# Patient Record
Sex: Male | Born: 1965
Health system: Southern US, Community
[De-identification: ages and names within clinical notes are randomized; demographics above are authoritative.]

## PROBLEM LIST (undated history)

## (undated) DIAGNOSIS — R3129 Other microscopic hematuria: Secondary | ICD-10-CM

## (undated) DIAGNOSIS — M545 Low back pain, unspecified: Secondary | ICD-10-CM

## (undated) DIAGNOSIS — F419 Anxiety disorder, unspecified: Secondary | ICD-10-CM

## (undated) DIAGNOSIS — N400 Enlarged prostate without lower urinary tract symptoms: Secondary | ICD-10-CM

## (undated) DIAGNOSIS — N529 Male erectile dysfunction, unspecified: Secondary | ICD-10-CM

## (undated) DIAGNOSIS — R3915 Urgency of urination: Secondary | ICD-10-CM

## (undated) DIAGNOSIS — N50811 Right testicular pain: Secondary | ICD-10-CM

## (undated) DIAGNOSIS — F32A Depression, unspecified: Secondary | ICD-10-CM

## (undated) DIAGNOSIS — F329 Major depressive disorder, single episode, unspecified: Secondary | ICD-10-CM

## (undated) DIAGNOSIS — I10 Essential (primary) hypertension: Secondary | ICD-10-CM

## (undated) DIAGNOSIS — Z1211 Encounter for screening for malignant neoplasm of colon: Secondary | ICD-10-CM

## (undated) DIAGNOSIS — R351 Nocturia: Secondary | ICD-10-CM

## (undated) DIAGNOSIS — J45909 Unspecified asthma, uncomplicated: Secondary | ICD-10-CM

## (undated) HISTORY — DX: Depression, unspecified: F32.A

## (undated) HISTORY — DX: Encounter for screening for malignant neoplasm of colon: Z12.11

## (undated) HISTORY — PX: OTHER SURGICAL HISTORY: SHX169

## (undated) HISTORY — DX: Anxiety disorder, unspecified: F41.9

## (undated) HISTORY — DX: Urgency of urination: R39.15

## (undated) HISTORY — DX: Low back pain: M54.5

## (undated) HISTORY — DX: Nocturia: R35.1

## (undated) HISTORY — DX: Major depressive disorder, single episode, unspecified: F32.9

## (undated) HISTORY — DX: Other microscopic hematuria: R31.29

## (undated) HISTORY — DX: Right testicular pain: N50.811

## (undated) HISTORY — DX: Essential (primary) hypertension: I10

## (undated) HISTORY — DX: Low back pain, unspecified: M54.50

## (undated) HISTORY — DX: Benign prostatic hyperplasia without lower urinary tract symptoms: N40.0

## (undated) HISTORY — DX: Male erectile dysfunction, unspecified: N52.9

---

## 2008-08-12 ENCOUNTER — Emergency Department: Payer: Self-pay | Admitting: Emergency Medicine

## 2008-09-19 ENCOUNTER — Ambulatory Visit: Payer: Self-pay | Admitting: Urology

## 2013-02-15 LAB — PSA: PSA: 2.5

## 2013-02-15 LAB — BASIC METABOLIC PANEL
BUN: 12 mg/dL (ref 4–21)
Creatinine: 0.8 mg/dL (ref <=1.3)
GLUCOSE: 104 mg/dL

## 2013-02-15 LAB — CBC AND DIFFERENTIAL: Hemoglobin: 15.2 g/dL (ref 13.5–17.5)

## 2013-02-15 LAB — LIPID PANEL
Cholesterol: 189 mg/dL (ref 0–200)
HDL: 46 mg/dL (ref 35–70)
LDL Cholesterol: 123 mg/dL
TRIGLYCERIDES: 101 mg/dL (ref 40–160)

## 2013-02-15 LAB — TSH: TSH: 1.9 u[IU]/mL (ref <=5.90)

## 2013-06-18 ENCOUNTER — Ambulatory Visit: Payer: Self-pay | Admitting: Emergency Medicine

## 2013-06-18 ENCOUNTER — Emergency Department: Payer: Self-pay | Admitting: Emergency Medicine

## 2015-01-03 ENCOUNTER — Ambulatory Visit: Payer: Self-pay | Admitting: Family Medicine

## 2015-05-26 ENCOUNTER — Other Ambulatory Visit: Payer: Self-pay | Admitting: Internal Medicine

## 2015-06-17 ENCOUNTER — Encounter: Payer: Self-pay | Admitting: Internal Medicine

## 2015-06-17 DIAGNOSIS — N400 Enlarged prostate without lower urinary tract symptoms: Secondary | ICD-10-CM | POA: Insufficient documentation

## 2015-06-17 DIAGNOSIS — C61 Malignant neoplasm of prostate: Secondary | ICD-10-CM | POA: Insufficient documentation

## 2015-06-17 DIAGNOSIS — N529 Male erectile dysfunction, unspecified: Secondary | ICD-10-CM | POA: Insufficient documentation

## 2015-06-17 DIAGNOSIS — I10 Essential (primary) hypertension: Secondary | ICD-10-CM | POA: Insufficient documentation

## 2015-06-28 ENCOUNTER — Ambulatory Visit: Payer: Self-pay | Admitting: Internal Medicine

## 2015-07-23 ENCOUNTER — Other Ambulatory Visit: Payer: Self-pay | Admitting: Internal Medicine

## 2015-09-12 ENCOUNTER — Encounter: Payer: Self-pay | Admitting: Internal Medicine

## 2015-09-25 ENCOUNTER — Other Ambulatory Visit: Payer: Self-pay | Admitting: Internal Medicine

## 2015-10-02 ENCOUNTER — Other Ambulatory Visit: Payer: Self-pay | Admitting: Internal Medicine

## 2015-10-21 ENCOUNTER — Other Ambulatory Visit: Payer: Self-pay | Admitting: Internal Medicine

## 2015-11-18 ENCOUNTER — Other Ambulatory Visit: Payer: Self-pay | Admitting: Internal Medicine

## 2015-12-08 NOTE — Telephone Encounter (Signed)
Left 18messages for pt to call the office, pt never return any of my calls

## 2015-12-16 ENCOUNTER — Other Ambulatory Visit: Payer: Self-pay | Admitting: Internal Medicine

## 2015-12-25 ENCOUNTER — Other Ambulatory Visit: Payer: Self-pay | Admitting: Internal Medicine

## 2016-01-08 ENCOUNTER — Other Ambulatory Visit: Payer: Self-pay | Admitting: Internal Medicine

## 2016-01-08 ENCOUNTER — Other Ambulatory Visit: Payer: Self-pay

## 2016-01-08 ENCOUNTER — Telehealth: Payer: Self-pay

## 2016-01-08 MED ORDER — AMLODIPINE BESYLATE 5 MG PO TABS: 5.0000 mg | ORAL_TABLET | Freq: Every day | ORAL | Status: DC

## 2016-01-08 NOTE — Telephone Encounter (Signed)
I will still need to see him within the next three months.  We do work with patients who have no insurance.

## 2016-01-08 NOTE — Telephone Encounter (Signed)
Patient states that he has currently loss insurance and is unable to come in right now, secondary to lack of funds. He states that hopefully in the next few months he will have his insurance back.

## 2016-01-08 NOTE — Telephone Encounter (Signed)
Patient called requesting refill of medication.

## 2016-01-09 NOTE — Telephone Encounter (Signed)
LM informing patient.

## 2016-03-02 HISTORY — PX: SHOULDER OPEN ROTATOR CUFF REPAIR: SHX2407

## 2016-03-19 ENCOUNTER — Encounter: Payer: Self-pay | Admitting: Internal Medicine

## 2016-04-02 ENCOUNTER — Other Ambulatory Visit: Payer: Self-pay | Admitting: Internal Medicine

## 2016-05-09 ENCOUNTER — Ambulatory Visit
Admission: RE | Admit: 2016-05-09 | Discharge: 2016-05-09 | Disposition: A | Discharge status: Home / Self Care | Payer: Self-pay | Source: Ambulatory Visit | Attending: Urology | Admitting: Urology

## 2016-05-09 ENCOUNTER — Encounter: Payer: Self-pay | Admitting: Urology

## 2016-05-09 ENCOUNTER — Ambulatory Visit (INDEPENDENT_AMBULATORY_CARE_PROVIDER_SITE_OTHER): Payer: Self-pay | Admitting: Urology

## 2016-05-09 VITALS — BP 127/74 | HR 71 | Ht 71.0 in | Wt 167.6 lb

## 2016-05-09 DIAGNOSIS — N3941 Urge incontinence: Secondary | ICD-10-CM

## 2016-05-09 DIAGNOSIS — N401 Enlarged prostate with lower urinary tract symptoms: Secondary | ICD-10-CM

## 2016-05-09 DIAGNOSIS — M545 Low back pain, unspecified: Secondary | ICD-10-CM

## 2016-05-09 DIAGNOSIS — N138 Other obstructive and reflux uropathy: Secondary | ICD-10-CM

## 2016-05-09 DIAGNOSIS — R8290 Unspecified abnormal findings in urine: Secondary | ICD-10-CM

## 2016-05-09 DIAGNOSIS — R361 Hematospermia: Secondary | ICD-10-CM

## 2016-05-09 DIAGNOSIS — R300 Dysuria: Secondary | ICD-10-CM

## 2016-05-09 LAB — MICROSCOPIC EXAMINATION: BACTERIA UA: NONE SEEN

## 2016-05-09 LAB — URINALYSIS, COMPLETE
BILIRUBIN UA: NEGATIVE
Glucose, UA: NEGATIVE
KETONES UA: NEGATIVE
NITRITE UA: NEGATIVE
PH UA: 5.5 (ref 5.0–7.5)
RBC, UA: NEGATIVE
Specific Gravity, UA: 1.02 (ref 1.005–1.030)
UUROB: 0.2 mg/dL (ref 0.2–1.0)

## 2016-05-09 LAB — BLADDER SCAN AMB NON-IMAGING: Scan Result: 0

## 2016-05-09 NOTE — Progress Notes (Signed)
05/09/2016 10:05 AM   Nicholas [name] Mar 02, 1966 [id number]  Referring provider: No referring provider defined for this encounter.  Chief Complaint  Patient presents with  . Cloudy urine with bad odor  . Blood in semen    HPI: Patient is a 50 year old African American male who presents today requesting an appointment for dysuria, malodorous cloudy urine and hematospermia.    He states one week ago, he was awaken with a strong urge to void.  He experienced dysuria and cloudy urine with that first void.  He then had no difficulty voiding the rest of the day.  The next day, the same symptoms.  On the third day, the symptoms persisted throughout the entire day.  He then took his wife's Macrobid for 5 days and the symptoms abated.   He did not experiencing gross hematuria, suprapubic pain or flank pain.  He does admit to back pain.  He did not have fevers, chills, nausea or vomiting.  He states he has been having hematospermia for the last 6 months.  He is not having painful ejaculations, painful erections or experienced any trauma to the penis.    Today, he is still experiencing dysuria.  His UA was positive for > 30 WBC's/hpf.  His PVR is 0 mL.  His IPSS score is 12/2.        IPSS      05/09/16 0900       International Prostate Symptom Score   How often have you had the sensation of not emptying your bladder? Less than half the time     How often have you had to urinate less than every two hours? Less than 1 in 5 times     How often have you found you stopped and started again several times when you urinated? Less than 1 in 5 times     How often have you found it difficult to postpone urination? Less than half the time     How often have you had a weak urinary stream? Less than half the time     How often have you had to strain to start urination? About half the time     How many times did you typically get up at night to urinate? 1 Time     Total IPSS Score 12     Quality of Life due to urinary symptoms   If you were to spend the rest of your life with your urinary condition just the way it is now how would you feel about that? Mostly Satisfied        Score:  1-7 Mild 8-19 Moderate 20-35 Severe     PMH: Past Medical History  Diagnosis Date  . Low back pain   . ED (erectile dysfunction)   . HTN (hypertension)   . Microscopic hematuria   . BPH (benign prostatic hyperplasia)   . Nocturia   . Urgency of micturation     Surgical History: Past Surgical History  Procedure Laterality Date  . Shoulder surgery Right   . Shoulder open rotator cuff repair Right 03/2016    Home Medications:    Medication List       This list is accurate as of: 05/09/16 10:05 AM.  Always use your most recent med list.               amLODipine 5 MG tablet  Commonly known as:  NORVASC  TAKE 1 TABLET (5 MG TOTAL) BY MOUTH  DAILY.     cyclobenzaprine 10 MG tablet  Commonly known as:  FLEXERIL  Take 10 mg by mouth 3 (three) times daily as needed. Reported on 05/09/2016     escitalopram 10 MG tablet  Commonly known as:  LEXAPRO  Take by mouth. Reported on 05/09/2016     HYDROcodone-acetaminophen 5-325 MG tablet  Commonly known as:  NORCO/VICODIN  Take 1 tablet by mouth every 6 (six) hours as needed. Reported on 05/09/2016     traMADol 50 MG tablet  Commonly known as:  ULTRAM  Take 50 mg by mouth. Reported on 05/09/2016        Allergies: No Known Allergies  Family History: Family History  Problem Relation Age of Onset  . CAD Mother   . Lung cancer Father   . Kidney disease Neg Hx   . Prostate cancer Neg Hx   . COPD Mother     Social History:  reports that he has never smoked. He does not have any smokeless tobacco history on file. He reports that he drinks about 1.2 oz of alcohol per week. He reports that he does not use illicit drugs.  ROS: UROLOGY Frequent Urination?: No Hard to postpone urination?: No Burning/pain with urination?: Yes Get  up at night to urinate?: No Leakage of urine?: No Urine stream starts and stops?: No Trouble starting stream?: No Do you have to strain to urinate?: No Blood in urine?: No Urinary tract infection?: Yes Sexually transmitted disease?: No Injury to kidneys or bladder?: No Painful intercourse?: No Weak stream?: No Erection problems?: No Penile pain?: No  Gastrointestinal Nausea?: No Vomiting?: No Indigestion/heartburn?: No Diarrhea?: No Constipation?: No  Constitutional Fever: No Night sweats?: No Weight loss?: No Fatigue?: No  Skin Skin rash/lesions?: No Itching?: No  Eyes Blurred vision?: No Double vision?: No  Ears/Nose/Throat Sore throat?: No Sinus problems?: No  Hematologic/Lymphatic Swollen glands?: No Easy bruising?: No  Cardiovascular Leg swelling?: No Chest pain?: No  Respiratory Cough?: No Shortness of breath?: No  Endocrine Excessive thirst?: No  Musculoskeletal Back pain?: No Joint pain?: No  Neurological Headaches?: No Dizziness?: No  Psychologic Depression?: No Anxiety?: No  Physical Exam: BP 127/74 mmHg  Pulse 71  Ht 5\' 11"  (1.803 m)  Wt 167 lb 9.6 oz (76.023 kg)  BMI 23.39 kg/m2  Constitutional: Well nourished. Alert and oriented, No acute distress. HEENT: [place] AT, moist mucus membranes. Trachea midline, no masses. Cardiovascular: No clubbing, cyanosis, or edema. Respiratory: Normal respiratory effort, no increased work of breathing. GI: Abdomen is soft, non tender, non distended, no abdominal masses. Liver and spleen not palpable.  No hernias appreciated.  Stool sample for occult testing is not indicated.   GU: No CVA tenderness.  No bladder fullness or masses.  Patient with circumcised phallus.  Urethral meatus is patent.  No penile discharge. No penile lesions or rashes. Scrotum without lesions, cysts, rashes and/or edema.  Testicles are located scrotally bilaterally. No masses are appreciated in the testicles. Left and right  epididymis are normal. Rectal: Patient with  normal sphincter tone. Anus and perineum without scarring or rashes. No rectal masses are appreciated. Prostate is approximately 55 grams, deep median sulcus, no nodules are appreciated. Seminal vesicles are normal. Skin: No rashes, bruises or suspicious lesions. Lymph: No cervical or inguinal adenopathy. Neurologic: Grossly intact, no focal deficits, moving all 4 extremities. Psychiatric: Normal mood and affect.  Laboratory Data: Lab Results  Component Value Date   HGB 15.2 02/15/2013    Lab Results  Component Value Date   CREATININE 0.8 02/15/2013    Lab Results  Component Value Date   PSA 2.5 02/15/2013     Lab Results  Component Value Date   TSH 1.90 02/15/2013       Component Value Date/Time   CHOL 189 02/15/2013   HDL 46 02/15/2013   LDLCALC 123 02/15/2013      Urinalysis Results for orders placed or performed in visit on 05/09/16  CULTURE, URINE COMPREHENSIVE  Result Value Ref Range   Urine Culture, Comprehensive Final report (A)    Result 1 Proteus mirabilis (A)    ANTIMICROBIAL SUSCEPTIBILITY Comment   Microscopic Examination  Result Value Ref Range   WBC, UA 11-30 (A) 0 -  5 /hpf   RBC, UA 0-2 0 -  2 /hpf   Epithelial Cells (non renal) 0-10 0 - 10 /hpf   Mucus, UA Present (A) Not Estab.   Bacteria, UA None seen None seen/Few  Urinalysis, Complete  Result Value Ref Range   Specific Gravity, UA 1.020 1.005 - 1.030   pH, UA 5.5 5.0 - 7.5   Color, UA Yellow Yellow   Appearance Ur Hazy (A) Clear   Leukocytes, UA 1+ (A) Negative   Protein, UA Trace (A) Negative/Trace   Glucose, UA Negative Negative   Ketones, UA Negative Negative   RBC, UA Negative Negative   Bilirubin, UA Negative Negative   Urobilinogen, Ur 0.2 0.2 - 1.0 mg/dL   Nitrite, UA Negative Negative   Microscopic Examination See below:   PSA  Result Value Ref Range   Prostate Specific Ag, Serum 35.9 (H) 0.0 - 4.0 ng/mL  BLADDER SCAN AMB  NON-IMAGING  Result Value Ref Range   Scan Result 0    Pertinent Imaging: Results for SANEL, SEUFERT (MRN [id number]) as of 05/13/2016 09:45  Ref. Range 05/09/2016 09:53  Scan Result Unknown 0    Assessment & Plan:    1. Dysuria:   UA significant for pus cells.  I will send for culture.  I will hold on prescribing an antibiotic at this time until sensitivities are available.  I will obtain a KUB at this time to rule out stones.    - Urinalysis, Complete - CULTURE, URINE COMPREHENSIVE - BLADDER SCAN AMB NON-IMAGING  2. Malodorous urine:  See above.    3. Back pain:   See above.  4. Hematospermia:   Patient is reassured that this is a benign finding and self limiting.    5. BPH with LUTS:   It has been over two years since patient has had his last PSA.  I will obtain one today as he is inconsistent in his follow ups.     Return for pending KUB and PSA results.  These notes generated with voice recognition software. I apologize for typographical errors.  Zara Council, [facility] [address], [place], [place] [address] [phone]

## 2016-05-10 ENCOUNTER — Telehealth: Payer: Self-pay

## 2016-05-10 LAB — PSA: PROSTATE SPECIFIC AG, SERUM: 35.9 ng/mL — AB (ref 0.0–4.0)

## 2016-05-10 NOTE — Telephone Encounter (Signed)
Spoke with pt in reference to lab results. Pt voiced understanding.  

## 2016-05-10 NOTE — Telephone Encounter (Signed)
LMOM

## 2016-05-10 NOTE — Telephone Encounter (Signed)
-----   Message from Nori Riis, PA-C sent at 05/10/2016  8:24 AM EDT ----- Patient's PSA is elevated, most likely this is due to an infection.  His urine culture is still pending at this time.  We will need to start an antibiotic when the culture results are available on Monday.

## 2016-05-10 NOTE — Telephone Encounter (Signed)
-----   Message from Nori Riis, PA-C sent at 05/09/2016  1:37 PM EDT ----- No stones seen on KUB.  PSA is still pending.

## 2016-05-12 LAB — CULTURE, URINE COMPREHENSIVE

## 2016-05-13 ENCOUNTER — Telehealth: Payer: Self-pay | Admitting: Urology

## 2016-05-13 DIAGNOSIS — N410 Acute prostatitis: Secondary | ICD-10-CM

## 2016-05-13 MED ORDER — AMOXICILLIN-POT CLAVULANATE 875-125 MG PO TABS: 1.0000 | ORAL_TABLET | Freq: Two times a day (BID) | ORAL | Status: AC

## 2016-05-13 NOTE — Telephone Encounter (Signed)
Spoke with pt in reference to abx. Made aware sent to pharmacy. Pt voiced understanding. Pt requested u/s results. Please advise.

## 2016-05-13 NOTE — Telephone Encounter (Signed)
Patient will need to start Augmentin 875/125 mg, one tablet twice daily for 30 days.  He then needs to have his PSA repeated.

## 2016-05-13 NOTE — Telephone Encounter (Signed)
Spoke with pt in reference to imaging. Pt voiced understanding.

## 2016-05-13 NOTE — Telephone Encounter (Signed)
I didn't order a RUS.  I ordered a KUB.  I have already sent a message back concerning those results.

## 2016-07-11 ENCOUNTER — Other Ambulatory Visit: Payer: Self-pay | Admitting: Internal Medicine

## 2016-07-12 ENCOUNTER — Encounter: Payer: Self-pay | Admitting: Internal Medicine

## 2016-07-12 ENCOUNTER — Ambulatory Visit (INDEPENDENT_AMBULATORY_CARE_PROVIDER_SITE_OTHER): Payer: Self-pay | Admitting: Internal Medicine

## 2016-07-12 ENCOUNTER — Other Ambulatory Visit: Payer: Self-pay | Admitting: Internal Medicine

## 2016-07-12 VITALS — BP 138/78 | HR 80 | Ht 71.0 in | Wt 165.0 lb

## 2016-07-12 DIAGNOSIS — I10 Essential (primary) hypertension: Secondary | ICD-10-CM

## 2016-07-12 DIAGNOSIS — F322 Major depressive disorder, single episode, severe without psychotic features: Secondary | ICD-10-CM

## 2016-07-12 MED ORDER — CITALOPRAM HYDROBROMIDE 20 MG PO TABS: 20.0000 mg | ORAL_TABLET | Freq: Every day | ORAL | 3 refills | Status: DC

## 2016-07-12 NOTE — Progress Notes (Signed)
Date:  07/12/2016   Name:  Nicholas [name]   DOB:  03-09-1966   MRN:  [id number]   Chief Complaint: Insomnia (arm/ shoulder pain making it hard to sleep or relax at night- feels like he may be depressed due to this being "the first time I've ever been out of work this long")  Arm Pain   Incident onset: rotator cuff surgery in April. The pain is present in the right shoulder. The quality of the pain is described as shooting and aching. The pain does not radiate. The pain has been fluctuating since the incident. Pertinent negatives include no chest pain.  Hypertension  Pertinent negatives include no chest pain, headaches, palpitations or shortness of breath.  Depression         This is a new problem.  The current episode started 1 to 4 weeks ago.   The onset quality is gradual.   Associated symptoms include appetite change, myalgias and suicidal ideas.  Associated symptoms include no fatigue and no headaches.     The symptoms are aggravated by social issues (since not working for 18 months).  Past treatments include nothing.  Past medical history includes physical disability.      Review of Systems  Constitutional: Positive for appetite change. Negative for fatigue and fever.  Eyes: Negative for visual disturbance.  Respiratory: Negative for chest tightness and shortness of breath.   Cardiovascular: Negative for chest pain, palpitations and leg swelling.  Musculoskeletal: Positive for arthralgias and myalgias.  Neurological: Negative for dizziness and headaches.  Psychiatric/Behavioral: Positive for depression, dysphoric mood, sleep disturbance and suicidal ideas. Negative for agitation and confusion.    Patient Active Problem List   Diagnosis Date Noted  . Failure of erection 06/17/2015  . Essential (primary) hypertension 06/17/2015  . Enlarged prostate 06/17/2015    Prior to Admission medications   Medication Sig Start Date End Date Taking? Authorizing Provider  amLODipine  (NORVASC) 5 MG tablet TAKE 1 TABLET (5 MG TOTAL) BY MOUTH DAILY. 04/02/16  Yes Glean Hess, MD  cyclobenzaprine (FLEXERIL) 10 MG tablet Take 10 mg by mouth 3 (three) times daily as needed. Reported on 05/09/2016 04/23/16  Yes Historical Provider, MD  escitalopram (LEXAPRO) 10 MG tablet Take by mouth. Reported on 05/09/2016 04/17/11  Yes Historical Provider, MD  HYDROcodone-acetaminophen (NORCO/VICODIN) 5-325 MG tablet Take 1 tablet by mouth every 6 (six) hours as needed. Reported on 05/09/2016 04/02/16  Yes Historical Provider, MD  traMADol (ULTRAM) 50 MG tablet Take 50 mg by mouth every 6 (six) hours as needed. for pain 07/04/16  Yes Historical Provider, MD    No Known Allergies  Past Surgical History:  Procedure Laterality Date  . SHOULDER OPEN ROTATOR CUFF REPAIR Right 03/2016  . shoulder surgery Right     Social History  Substance Use Topics  . Smoking status: Never Smoker  . Smokeless tobacco: Not on file  . Alcohol use 1.2 oz/week    2 Standard drinks or equivalent per week     Medication list has been reviewed and updated.   Physical Exam  Constitutional: He is oriented to person, place, and time. He appears well-developed. No distress.  HENT:  Head: Normocephalic and atraumatic.  Cardiovascular: Normal rate, regular rhythm and normal heart sounds.   Pulmonary/Chest: Effort normal and breath sounds normal. No respiratory distress.  Neurological: He is alert and oriented to person, place, and time.  Skin: Skin is warm and dry. No rash noted.  Psychiatric: His speech  is normal and behavior is normal. Thought content normal. Cognition and memory are normal. He exhibits a depressed mood.  Nursing note and vitals reviewed.   BP (!) 150/100   Pulse 80   Ht 5\' 11"  (1.803 m)   Wt 165 lb (74.8 kg)   BMI 23.01 kg/m   Assessment and Plan: 1. Severe single current episode of major depressive disorder, without psychotic features ([place]) Begin medication and refer to Psych at patient  request - citalopram (CELEXA) 20 MG tablet; Take 1 tablet (20 mg total) by mouth daily.  Dispense: 30 tablet; Refill: 3 - Ambulatory referral to Psychiatry  2. Essential (primary) hypertension controlled   Halina Maidens, MD [facility]  07/12/2016

## 2016-07-13 ENCOUNTER — Other Ambulatory Visit: Payer: Self-pay | Admitting: Internal Medicine

## 2016-07-18 ENCOUNTER — Encounter: Payer: Self-pay | Admitting: Licensed Clinical Social Worker

## 2016-07-18 ENCOUNTER — Ambulatory Visit (INDEPENDENT_AMBULATORY_CARE_PROVIDER_SITE_OTHER): Payer: Self-pay | Admitting: Licensed Clinical Social Worker

## 2016-07-18 DIAGNOSIS — F332 Major depressive disorder, recurrent severe without psychotic features: Secondary | ICD-10-CM

## 2016-07-18 DIAGNOSIS — F411 Generalized anxiety disorder: Secondary | ICD-10-CM

## 2016-07-18 DIAGNOSIS — F324 Major depressive disorder, single episode, in partial remission: Secondary | ICD-10-CM | POA: Insufficient documentation

## 2016-07-18 DIAGNOSIS — F325 Major depressive disorder, single episode, in full remission: Secondary | ICD-10-CM | POA: Insufficient documentation

## 2016-07-18 NOTE — Progress Notes (Signed)
Comprehensive Clinical Assessment (CCA) Note  07/18/2016 [name] [id number]  Visit Diagnosis:      ICD-9-CM ICD-10-CM   1. Severe episode of recurrent major depressive disorder, without psychotic features ([place]) 296.33 F33.2   2. Generalized anxiety disorder 300.02 F41.1       CCA Part One  Part One has been completed on paper by the patient.  (See scanned document in Chart Review)  CCA Part Two A  Intake/Chief Complaint:  CCA Intake With Chief Complaint CCA Part Two Date: 07/18/16 CCA Part Two Time: 1346 Chief Complaint/Presenting Problem: He has been having a lot of anxiety, depression, stress and he thinks it is related to getting injured at the job [employer]. He injured his back and shoulder so he has constant pain. Since then it has been like a roller coaster. The first time he got injured was November.  2013, from that time they found torn rotator cuff-surgery and bulging disc-steroid shots and physical therapy, He has surgery 2014, then did rehab and physical therapy and went back to work. He went back to work in May 2015, he worked 8 months, in December he injured himself again. He had another tear in his shoulder and lower back again. He has been out of work ever since. He has been out of work year and a half and second shoulder surgery April of this year. He started going to physical therapy and tell him to do exercises at home and his shoulder popped again. He has been having a constant pain in shoulder, back doctor want to give him steroid shots. Going through [employer] and has been there 28 years. Now he can't do anything and have him on permanent restriction this is completely different from the way it has been. He is the bread earner in the family and is not getting enough income and physical issues has limited his functioning. He can't sleep. He has been depressed and anxious for awhile.  Patients Currently Reported Symptoms/Problems: He has been depressed and anxious  since out of work 2nd time and when out of work for a year and 8 months, stressed out, he has thoughts if he wasn't here and the family would be better off. On a pain scale he is 8 out of ten with ten being severe Collateral Involvement: no Individual's Strengths: hard worker, likes to meet people,  Individual's Preferences: He wants to feel better about himself and feeling better Individual's Abilities: watch TV,  Type of Services Patient Feels Are Needed: therapy, medication management Initial Clinical Notes/Concerns: first psychiatric experience  Mental Health Symptoms Depression:  Depression: Change in energy/activity, Fatigue, Difficulty Concentrating, Hopelessness, Increase/decrease in appetite, Irritability, Sleep (too much or little), Tearfulness, Weight gain/loss, Worthlessness (not currently SI, but some days, passive SI, denies SA, SIB)  Mania:  Mania: N/A  Anxiety:   Anxiety: Difficulty concentrating, Fatigue, Irritability, Restlessness, Sleep, Tension, Worrying (daily excessively, interferes with functioning, )  Psychosis:  Psychosis: N/A  Trauma:  Trauma: N/A  Obsessions:  Obsessions: N/A  Compulsions:  Compulsions: N/A  Inattention:  Inattention: N/A  Hyperactivity/Impulsivity:  Hyperactivity/Impulsivity: N/A  Oppositional/Defiant Behaviors:  Oppositional/Defiant Behaviors: N/A  Borderline Personality:  Emotional Irregularity: N/A  Other Mood/Personality Symptoms:      Mental Status Exam Appearance and self-care  Stature:  Stature: Average  Weight:  Weight: Thin  Clothing:  Clothing: Casual  Grooming:  Grooming: Normal  Cosmetic use:  Cosmetic Use: None  Posture/gait:  Posture/Gait: Normal  Motor activity:  Motor Activity: Not  Remarkable  Sensorium  Attention:  Attention: Normal  Concentration:  Concentration: Normal  Orientation:  Orientation: X5  Recall/memory:     Affect and Mood  Affect:  Affect: Depressed  Mood:  Mood: Anxious, Depressed  Relating  Eye  contact:  Eye Contact: Normal  Facial expression:  Facial Expression: Depressed, Responsive  Attitude toward examiner:  Attitude Toward Examiner: Cooperative  Thought and Language  Speech flow: Speech Flow: Normal  Thought content:  Thought Content: Appropriate to mood and circumstances  Preoccupation:     Hallucinations:     Organization:     Transport planner of Knowledge:  Fund of Knowledge: Average  Intelligence:  Intelligence: Average  Abstraction:  Abstraction: Normal  Judgement:  Judgement: Fair  Art therapist:  Reality Testing: Realistic  Insight:  Insight: Fair  Decision Making:  Decision Making: Paralyzed  Social Functioning  Social Maturity:  Social Maturity: Isolates, Responsible  Social Judgement:  Social Judgement: Victimized, Normal  Stress  Stressors:  Stressors: Chiropodist, Illness, Work, Transitions, Housing (pain)  Coping Ability:  Coping Ability: Software engineer, Research officer, political party Deficits:     Supports:      Family and Psychosocial History: Family history Marital status: Married Number of Years Married: 21 What types of issues is patient dealing with in the relationship?: no, living situation-wife and two kids, supports-wife and kids Are you sexually active?: No What is your sexual orientation?: heterosexual Has your sexual activity been affected by drugs, alcohol, medication, or emotional stress?: stress, injury, pain Does patient have children?: Yes How many children?: 2 How is patient's relationship with their children?: 25 and 17-good relationship  Childhood History:  Childhood History By whom was/is the patient raised?: Both parents Additional childhood history information: good childhood Description of patient's relationship with caregiver when they were a child: mom, dad-good Patient's description of current relationship with people who raised him/her: mom-deceased, dad-good How were you disciplined when you got in trouble as a  child/adolescent?: hardly ever disciplined because patient was good Does patient have siblings?: Yes Number of Siblings: 1 Description of patient's current relationship with siblings: younger-good Did patient suffer any verbal/emotional/physical/sexual abuse as a child?: No Did patient suffer from severe childhood neglect?: No Has patient ever been sexually abused/assaulted/raped as an adolescent or adult?: No Was the patient ever a victim of a crime or a disaster?: No Witnessed domestic violence?: No Has patient been effected by domestic violence as an adult?: No  CCA Part Two B  Employment/Work Situation: Employment / Work Copywriter, advertising Employment situation:  Scientist, water quality) Patient's job has been impacted by current illness: Yes Describe how patient's job has been impacted: injured on job and not has full restriction What is the longest time patient has a held a job?: 28 Where was the patient employed at that time?: [employer] Has patient ever been in the [employer]?: No Has patient ever served in combat?: No Did You Receive Any Psychiatric Treatment/Services While in Passenger transport manager?: No Are There Guns or Other Weapons in [place]?: No  Education: Museum/gallery curator Currently Attending: no Last Grade Completed: 15 Name of [place]: [place] Did Teacher, adult education From [employer]?: Yes Did Physicist, medical?: Yes What Type of College Degree Do you Have?: three classes short of a degree Did You Attend Graduate School?: No 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, occupational Did You Have Any Special Interests In School?: sports-basketball Did You Have An Individualized Education Program (IIEP): No Did You Have Any Difficulty At [facility]?: No  Religion: Religion/Spirituality Are You A Religious Person?: Yes What is Your Religious Affiliation?: Christian How Might This Affect Treatment?: maybe  Leisure/Recreation: Leisure / Recreation Leisure and Hobbies: limited due to physcial  issues  Exercise/Diet: Exercise/Diet Do You Exercise?: Yes What Type of Exercise Do You Do?: Other (Comment) (physcial therapy) How Many Times a Week Do You Exercise?: 1-3 times a week Have You Gained or Lost A Significant Amount of Weight in the Past Six Months?: Yes-Lost Number of Pounds Lost?: 10 Do You Follow a Special Diet?: No Do You Have Any Trouble Sleeping?: Yes Explanation of Sleeping Difficulties: When he does sleep he has nightmares and sweats  CCA Part Two C  Alcohol/Drug Use: Alcohol / Drug Use Pain Medications: see med list Prescriptions: see med list Over the Counter: see med list History of alcohol / drug use?: No history of alcohol / drug abuse                      CCA Part Three  ASAM's:  Six Dimensions of Multidimensional Assessment  Dimension 1:  Acute Intoxication and/or Withdrawal Potential:     Dimension 2:  Biomedical Conditions and Complications:     Dimension 3:  Emotional, Behavioral, or Cognitive Conditions and Complications:     Dimension 4:  Readiness to Change:     Dimension 5:  Relapse, Continued use, or Continued Problem Potential:     Dimension 6:  Recovery/Living Environment:      Substance use Disorder (SUD)    Social Function:  Social Functioning Social Maturity: Isolates, Responsible Social Judgement: Victimized, Normal  Stress:  Stress Stressors: Chiropodist, Illness, Work, Transitions, Housing (pain) Coping Ability: Overwhelmed, Exhausted Patient Takes Medications The Way The Doctor Instructed?: Yes Priority Risk: Low Acuity  Risk Assessment- Self-Harm Potential: Risk Assessment For Self-Harm Potential Thoughts of Self-Harm: No current thoughts Method: No plan Availability of Means: No access/NA  Risk Assessment -Dangerous to Others Potential: Risk Assessment For Dangerous to Others Potential Method: No Plan Availability of Means: No access or NA Intent: Vague intent or NA Notification Required: No need or  identified person  DSM5 Diagnoses: Patient Active Problem List   Diagnosis Date Noted  . Severe episode of recurrent major depressive disorder, without psychotic features ([place]) 07/18/2016  . Generalized anxiety disorder 07/18/2016  . Failure of erection 06/17/2015  . Essential (primary) hypertension 06/17/2015  . Enlarged prostate 06/17/2015    Patient Centered Plan: Patient is on the following Treatment Plan(s):  Anxiety, Depression and Low Self-Esteem  Recommendations for Services/Supports/Treatments: Recommendations for Services/Supports/Treatments Recommendations For Services/Supports/Treatments: Individual Therapy, Medication Management  Treatment Plan Summary: Patient is a 50 year old male who reports having a lot of anxiety, depression, stress and he thinks is related to getting injured at the job at [employer] where he had been working for 28 years. He injured his back and shoulder and his constant pain. He rates his pain as an 8 out of 10 in severity. He was first injured in November 2013 had surgery, steroid shots and physical therapy and went back to work. He said that his depression and anxiety has gotten worse since he's been out of work the second time and out of work for a year and 8 months. He has severe depression problems with sleep due to pain and anxiety and passive SI although not currently suicidal. Denies past SA, SIB or HI. He denies drug/alcohol abuse. He attracted for safety with therapist and agreed to call 911 or go to local  emergency rooms if having suicidal thoughts. He relates that pain issues have limited his functioning and self image and financial issues as he is always been the provider. Reviewed plan for patient and he will discuss getting more effective pain management treatment with his provider. Discussed his strengths and skills and reviewed some career options with him. Patient is recommended for therapy for supportive interventions, strength-based, coping  strategies and skills to increase self-esteem. Patient is to be referred for psychiatric evaluation for med management. Patient is to review policy to see whether therapist is included as a provider to schedule another therapy appointment and if not he will look for a therapist that is included under his policy.    Referrals to Alternative Service(s): Referred to Alternative Service(s):   Place:   Date:   Time:    Referred to Alternative Service(s):   Place:   Date:   Time:    Referred to Alternative Service(s):   Place:   Date:   Time:    Referred to Alternative Service(s):   Place:   Date:   Time:     Clavin Ruhlman A

## 2016-08-01 ENCOUNTER — Ambulatory Visit (INDEPENDENT_AMBULATORY_CARE_PROVIDER_SITE_OTHER): Payer: Self-pay | Admitting: Psychiatry

## 2016-08-01 ENCOUNTER — Encounter: Payer: Self-pay | Admitting: Psychiatry

## 2016-08-01 VITALS — BP 148/95 | HR 80 | Temp 97.8°F | Ht 71.0 in | Wt 165.2 lb

## 2016-08-01 DIAGNOSIS — F332 Major depressive disorder, recurrent severe without psychotic features: Secondary | ICD-10-CM

## 2016-08-01 DIAGNOSIS — F411 Generalized anxiety disorder: Secondary | ICD-10-CM

## 2016-08-01 DIAGNOSIS — F322 Major depressive disorder, single episode, severe without psychotic features: Secondary | ICD-10-CM

## 2016-08-01 MED ORDER — TRAZODONE HCL 100 MG PO TABS
100.0000 mg | ORAL_TABLET | Freq: Every day | ORAL | 1 refills | Status: DC
Start: 2016-08-01 — End: 2016-09-04

## 2016-08-01 MED ORDER — CITALOPRAM HYDROBROMIDE 40 MG PO TABS: 40.0000 mg | ORAL_TABLET | Freq: Every day | ORAL | 1 refills | Status: DC
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
## 2016-08-08 DIAGNOSIS — M79673 Pain in unspecified foot: Secondary | ICD-10-CM

## 2016-08-16 ENCOUNTER — Telehealth: Payer: Self-pay

## 2016-08-16 NOTE — Telephone Encounter (Signed)
pt states that medication is not helpping .  pt states that you told him that if it doesn't seem to help call and let you know.  pt is takin the trazodone and celexa.   Pt was seen on  08-01-16  Next appt  09-09-16.

## 2016-08-19 NOTE — Telephone Encounter (Signed)
Please call patient and have him schedule an earlier appointment sometime next week thank you

## 2016-08-19 NOTE — Telephone Encounter (Signed)
pt states he was just in here and that he did not have money to come back.  he states that you told him that he could just call and that you would increase it.

## 2016-08-20 NOTE — Telephone Encounter (Signed)
Patient is on the highest dose of Celexa in the medication dose cannot be increased at this time. Patient will need to make another appointment to assess his depression and what may be the appropriate medication. Patient was just seen for 1 visit here.

## 2016-08-26 DIAGNOSIS — M79673 Pain in unspecified foot: Secondary | ICD-10-CM

## 2016-08-30 ENCOUNTER — Other Ambulatory Visit: Payer: Self-pay | Admitting: Internal Medicine

## 2016-08-30 MED ORDER — AMLODIPINE BESYLATE 5 MG PO TABS: 5.0000 mg | ORAL_TABLET | Freq: Every day | ORAL | 1 refills | Status: DC

## 2016-09-04 ENCOUNTER — Ambulatory Visit (INDEPENDENT_AMBULATORY_CARE_PROVIDER_SITE_OTHER): Payer: Self-pay | Admitting: Licensed Clinical Social Worker

## 2016-09-04 ENCOUNTER — Other Ambulatory Visit (HOSPITAL_COMMUNITY): Payer: Self-pay | Admitting: Psychiatry

## 2016-09-04 DIAGNOSIS — F332 Major depressive disorder, recurrent severe without psychotic features: Secondary | ICD-10-CM

## 2016-09-04 DIAGNOSIS — F411 Generalized anxiety disorder: Secondary | ICD-10-CM

## 2016-09-04 MED ORDER — TRAZODONE HCL 150 MG PO TABS: 150.0000 mg | ORAL_TABLET | Freq: Every day | ORAL | 1 refills | Status: DC

## 2016-09-04 MED ORDER — VENLAFAXINE HCL ER 37.5 MG PO CP24: 37.5000 mg | ORAL_CAPSULE | Freq: Every day | ORAL | 2 refills | Status: DC

## 2016-09-04 NOTE — Progress Notes (Signed)
   THERAPIST PROGRESS NOTE  Session Time: 10 AM to 10:40 AM  Participation Level: Active  Behavioral Response: CasualAlertDysphoric  Type of Therapy: Individual Therapy  Treatment Goals addressed:  decrease anxiety, decrease depression, improve coping skills  Interventions: CBT, Solution Focused, Strength-based and Supportive  Summary: Nicholas [name] is [name] 50 y.o. male who presents as [name] for therapy and who is seeing therapist one time previously. Patient provided update of continued pain in his shoulder. He has done 2 surgeries so this is something his can have to get used to although he saving up for device recommended that will help with the pain. He is doing physical therapy that helps [name]. He has gotten [name] couple injections in his back and may need surgery. He reports symptoms of  anxiety, not sleeping still stresed out. He wants to focus on ways to deal with the pain with his medical doctors and work with psychiatrist to help him in getting more mentally stable before working with resources to help him make transitions in his life. He is not interested in therapy at this point. He also said financially he is not able to afford therapy and seeing [name] psychiatrist.   Suicidal/Homicidal: No  Therapist Response: Reviewed update of patient's symptoms. Work with patient on developing schema to help him recognize that change and transitioning is part of life to help him more effectively cope. Discussed therapy as an effective tool and in addition some type of case management to help him connect to resources. Provided emotionally supportive interventions and validated choice in terms of types of treatment he wants to move forward with.   Plan: 1.patient continue to work with medical doctor and continue med management for his mental health symptoms per his request.   Diagnosis: Axis I:  major depressive disorder, recurrent, severe, generalized anxiety  disorder    Axis II: No diagnosis    Nicholas Montijo A, LCSW 09/04/2016

## 2016-09-09 ENCOUNTER — Ambulatory Visit: Payer: Self-pay | Admitting: Psychiatry

## 2016-09-25 ENCOUNTER — Other Ambulatory Visit: Payer: Self-pay | Admitting: Psychiatry

## 2016-09-25 DIAGNOSIS — F322 Major depressive disorder, single episode, severe without psychotic features: Secondary | ICD-10-CM

## 2016-10-10 ENCOUNTER — Emergency Department
Admission: EM | Admit: 2016-10-10 | Discharge: 2016-10-11 | Disposition: A | Discharge status: Home / Self Care | Payer: Self-pay | Attending: Emergency Medicine | Admitting: Emergency Medicine

## 2016-10-10 DIAGNOSIS — I10 Essential (primary) hypertension: Secondary | ICD-10-CM | POA: Insufficient documentation

## 2016-10-10 DIAGNOSIS — Z79899 Other long term (current) drug therapy: Secondary | ICD-10-CM | POA: Insufficient documentation

## 2016-10-10 DIAGNOSIS — Z791 Long term (current) use of non-steroidal anti-inflammatories (NSAID): Secondary | ICD-10-CM | POA: Insufficient documentation

## 2016-10-10 NOTE — ED Provider Notes (Signed)
[facility] Emergency Department Provider Note  ____________________________________________  Time seen: Approximately 11:40 PM  I have reviewed the triage vital signs and the nursing notes.   HISTORY  Chief Complaint Hypertension    HPI Nicholas [name] is a 50 y.o. male who comes to the ED for evaluation of his high blood pressure. He normally is prescribed amlodipine 5 mg daily for blood pressure, but is been off it for 5 days. He saw his primary care doctor today who told him that he could die if his blood pressure goes untreated so he took 10 mg of amlodipine today and came to the emergency room. He denies any complaints at all. Eating and drinking normally. No chest pain or vision changes headache shortness of breath abdominal pain back pain or peripheral edema     Past Medical History:  Diagnosis Date  . Anxiety   . BPH (benign prostatic hyperplasia)   . Depression   . ED (erectile dysfunction)   . HTN (hypertension)   . Low back pain   . Microscopic hematuria   . Nocturia   . Urgency of micturation      Patient Active Problem List   Diagnosis Date Noted  . Severe episode of recurrent major depressive disorder, without psychotic features ([place]) 07/18/2016  . Generalized anxiety disorder 07/18/2016  . Failure of erection 06/17/2015  . Essential (primary) hypertension 06/17/2015  . Enlarged prostate 06/17/2015     Past Surgical History:  Procedure Laterality Date  . SHOULDER OPEN ROTATOR CUFF REPAIR Right 03/2016  . shoulder surgery Right      Prior to Admission medications   Medication Sig Start Date End Date Taking? Authorizing Provider  amLODipine (NORVASC) 5 MG tablet Take 1 tablet (5 mg total) by mouth daily. 08/30/16   Glean Hess, MD  citalopram (CELEXA) 40 MG tablet Take 1 tablet (40 mg total) by mouth daily. 08/01/16   Himabindu Ravi, MD  cyclobenzaprine (FLEXERIL) 10 MG tablet Take 10 mg by mouth 3 (three) times daily  as needed. Reported on 05/09/2016 04/23/16   Historical Provider, MD  HYDROcodone-acetaminophen (NORCO/VICODIN) 5-325 MG tablet Take 1 tablet by mouth every 6 (six) hours as needed. Reported on 05/09/2016 04/02/16   Historical Provider, MD  traMADol (ULTRAM) 50 MG tablet Take 50 mg by mouth every 6 (six) hours as needed. for pain 07/04/16   Historical Provider, MD  traZODone (DESYREL) 150 MG tablet Take 1 tablet (150 mg total) by mouth at bedtime. 09/04/16   Himabindu Ravi, MD  venlafaxine XR (EFFEXOR XR) 37.5 MG 24 hr capsule Take 1 capsule (37.5 mg total) by mouth daily. 09/04/16 09/04/17  Elvin So, MD     Allergies Patient has no known allergies.   Family History  Problem Relation Age of Onset  . CAD Mother   . COPD Mother   . Lung cancer Father   . Kidney disease Neg Hx   . Prostate cancer Neg Hx     Social History Social History  Substance Use Topics  . Smoking status: Never Smoker  . Smokeless tobacco: Never Used  . Alcohol use No    Review of Systems  Constitutional:   No fever or chills.  Cardiovascular:   No chest pain. Respiratory:   No dyspnea or cough. Gastrointestinal:   Negative for abdominal pain, vomiting and diarrhea.   Neurological:   Negative for headaches 10-point ROS otherwise negative.  ____________________________________________   PHYSICAL EXAM:  VITAL SIGNS: ED Triage Vitals  Enc Vitals Group     BP 10/10/16 2234 (!) 164/94     Pulse Rate 10/10/16 2234 (!) 58     Resp 10/10/16 2234 18     Temp 10/10/16 2234 98 F (36.7 C)     Temp Source 10/10/16 2234 Oral     SpO2 10/10/16 2234 97 %     Weight 10/10/16 2233 170 lb (77.1 kg)     Height 10/10/16 2233 5\' 11"  (1.803 m)     Head Circumference --      Peak Flow --      Pain Score --      Pain Loc --      Pain Edu? --      Excl. in [place]? --     Vital signs reviewed, nursing assessments reviewed.   Constitutional:   Alert and oriented. Well appearing and in no distress. Eyes:   No scleral  icterus. No conjunctival pallor. PERRL. EOMI.  No nystagmus. ENT   Head:   Normocephalic and atraumatic.   Nose:   No congestion/rhinnorhea. No septal hematoma   Mouth/Throat:   MMM, no pharyngeal erythema. No peritonsillar mass.    Neck:   No stridor. No SubQ emphysema. No meningismus. Hematological/Lymphatic/Immunilogical:   No cervical lymphadenopathy. Cardiovascular:   RRR. Symmetric bilateral radial and DP pulses.  No murmurs.  Respiratory:   Normal respiratory effort without tachypnea nor retractions. Breath sounds are clear and equal bilaterally. No wheezes/rales/rhonchi. Gastrointestinal:   Soft and nontender. Non distended. There is no CVA tenderness.  No rebound, rigidity, or guarding. Neurologic:   Normal speech and language.  CN 2-10 normal. Motor grossly intact. No gross focal neurologic deficits are appreciated.   ____________________________________________    LABS (pertinent positives/negatives) (all labs ordered are listed, but only abnormal results are displayed) Labs Reviewed - No data to display ____________________________________________   EKG    ____________________________________________    RADIOLOGY    ____________________________________________   PROCEDURES Procedures  ____________________________________________   INITIAL IMPRESSION / ASSESSMENT AND PLAN / ED COURSE  Pertinent labs & imaging results that were available during my care of the patient were reviewed by me and considered in my medical decision making (see chart for details).  Patient well appearing no acute distress. Asymptomatic stage II hypertension. Advised patient to continue his amlodipine and follow up with his primary care doctor in a week with rechecking his blood pressure.     Clinical Course    ____________________________________________   FINAL CLINICAL IMPRESSION(S) / ED DIAGNOSES  Final diagnoses:  Essential hypertension        Portions of this note were generated with dragon dictation software. Dictation errors may occur despite best attempts at proofreading.    Carrie Mew, MD 10/10/16 [phone]

## 2016-10-10 NOTE — ED Triage Notes (Addendum)
Patient ambulatory to triage with steady gait, without difficulty or distress noted; pt reports seen a PCP today for back pain and noted BP was elevated; off BP med x week; rx amlodipine 5mg  daily (took 2 doses today after noting elevation); pt denies any accomp symptoms

## 2016-10-30 ENCOUNTER — Telehealth: Payer: Self-pay | Admitting: *Deleted

## 2016-11-04 ENCOUNTER — Other Ambulatory Visit (HOSPITAL_COMMUNITY): Payer: Self-pay | Admitting: Psychiatry

## 2016-11-30 ENCOUNTER — Other Ambulatory Visit (HOSPITAL_COMMUNITY): Payer: Self-pay | Admitting: Psychiatry

## 2016-12-09 NOTE — Telephone Encounter (Signed)
message was left with answering services that patient needs refill on effexor.

## 2016-12-09 NOTE — Telephone Encounter (Signed)
left message on doctor's line rx fro effexor xr 37.5 mg 24 hr capsule #30 no additional refills pt needs appt.

## 2016-12-09 NOTE — Telephone Encounter (Signed)
called and told patient that he will need to make an appt to be seen that I can send in a month supply but he was last seen on  08-01-16.

## 2016-12-24 ENCOUNTER — Telehealth: Payer: Self-pay

## 2016-12-24 NOTE — Telephone Encounter (Signed)
left message on doctor's line refilling trazodone 150mg  with no additional

## 2016-12-24 NOTE — Telephone Encounter (Signed)
pt called pt needed refill on trazodone will not have enough to last until appt.

## 2016-12-27 ENCOUNTER — Ambulatory Visit: Payer: Self-pay | Admitting: Psychiatry

## 2017-01-05 ENCOUNTER — Other Ambulatory Visit (HOSPITAL_COMMUNITY): Payer: Self-pay | Admitting: Psychiatry

## 2017-01-10 ENCOUNTER — Encounter: Payer: Self-pay | Admitting: Psychiatry

## 2017-01-10 ENCOUNTER — Ambulatory Visit (INDEPENDENT_AMBULATORY_CARE_PROVIDER_SITE_OTHER): Payer: Self-pay | Admitting: Psychiatry

## 2017-01-10 VITALS — BP 148/96 | HR 71 | Temp 98.2°F | Wt 175.4 lb

## 2017-01-10 DIAGNOSIS — F332 Major depressive disorder, recurrent severe without psychotic features: Secondary | ICD-10-CM

## 2017-01-10 MED ORDER — TRAZODONE HCL 100 MG PO TABS: 200.0000 mg | ORAL_TABLET | Freq: Every day | ORAL | 1 refills | Status: DC

## 2017-01-10 MED ORDER — VENLAFAXINE HCL ER 75 MG PO CP24: 75.0000 mg | ORAL_CAPSULE | Freq: Every day | ORAL | 1 refills | Status: DC

## 2017-01-10 NOTE — Progress Notes (Signed)
Psychiatric  progress note  Patient Identification: Nicholas [name] MRN:  [id number] Date of Evaluation:  01/10/2017 Referral Source: Halina Maidens, MD Chief Complaint:   depression  Visit Diagnosis:    ICD-9-CM ICD-10-CM   1. Severe episode of recurrent major depressive disorder, without psychotic features ([place]) 296.33 F33.2     History of Present Illness:  Patient is  A 51 yo AAM who presents  Today for a follow up of severe Anxiety and depression . Patient has not been able to follow up with his appointments. He was discontinued on the Celexa and reports tolerating it well. He was started on the Effexor at 37.5 mg and reports tolerating well. Continues to report significant depression and anxiety. States his main problems are also with sleep and with the pain. His working with a pain specialist. States that it's very difficult for him financially to make the appointments. Denies any suicidal thoughts.  Past Psychiatric History: Has never been hospitalized psychiatrically, denies any suicide attempts.  Previous Psychotropic Medications: Yes   Substance Abuse History in the last 12 months:  No.  Consequences of Substance Abuse: Negative  Past Medical History:  Past Medical History:  Diagnosis Date  . Anxiety   . BPH (benign prostatic hyperplasia)   . Depression   . ED (erectile dysfunction)   . HTN (hypertension)   . Low back pain   . Microscopic hematuria   . Nocturia   . Urgency of micturation     Past Surgical History:  Procedure Laterality Date  . SHOULDER OPEN ROTATOR CUFF REPAIR Right 03/2016  . shoulder surgery Right     Family Psychiatric History: none  Family History:  Family History  Problem Relation Age of Onset  . CAD Mother   . COPD Mother   . Lung cancer Father   . Kidney disease Neg Hx   . Prostate cancer Neg Hx     Social History:   Social History   Social History  . Marital status: Married    Spouse name: N/A  . Number of children: N/A   . Years of education: N/A   Social History Main Topics  . Smoking status: Never Smoker  . Smokeless tobacco: Never Used  . Alcohol use No  . Drug use: No  . Sexual activity: Yes    Birth control/ protection: None   Other Topics Concern  . Not on file   Social History Narrative  . No narrative on file    Additional Social History: Patient reports living with his wife and his children aged 30 and 88.  Allergies:  No Known Allergies  Metabolic Disorder Labs: No results found for: HGBA1C, MPG No results found for: PROLACTIN Lab Results  Component Value Date   CHOL 189 02/15/2013   TRIG 101 02/15/2013   HDL 46 02/15/2013   LDLCALC 123 02/15/2013     Current Medications: Current Outpatient Prescriptions  Medication Sig Dispense Refill  . amLODipine (NORVASC) 5 MG tablet Take 1 tablet (5 mg total) by mouth daily. 90 tablet 1  . citalopram (CELEXA) 40 MG tablet Take 1 tablet (40 mg total) by mouth daily. 30 tablet 1  . cyclobenzaprine (FLEXERIL) 10 MG tablet Take 10 mg by mouth 3 (three) times daily as needed. Reported on 05/09/2016  0  . HYDROcodone-acetaminophen (NORCO/VICODIN) 5-325 MG tablet Take 1 tablet by mouth every 6 (six) hours as needed. Reported on 05/09/2016  0  . traMADol (ULTRAM) 50 MG tablet Take 50 mg by mouth  every 6 (six) hours as needed. for pain  0  . traZODone (DESYREL) 150 MG tablet Take 1 tablet (150 mg total) by mouth at bedtime. 30 tablet 1  . venlafaxine XR (EFFEXOR XR) 37.5 MG 24 hr capsule Take 1 capsule (37.5 mg total) by mouth daily. 30 capsule 2   No current facility-administered medications for this visit.     Neurologic: Headache: No Seizure: No Paresthesias:No  Musculoskeletal: Strength & Muscle Tone: within normal limits Gait & Station: normal Patient leans: N/A  Psychiatric Specialty Exam: ROS  There were no vitals taken for this visit.There is no height or weight on file to calculate BMI.  General Appearance: Negative  Eye  Contact:  Fair  Speech:  Slow  Volume:  Decreased  Mood:  Depressed and dysphoric   Affect:  Constricted and Depressed  Thought Process:  Coherent  Orientation:  Full (Time, Place, and Person)  Thought Content:  WDL  Suicidal Thoughts:  No  Homicidal Thoughts:  No  Memory:  Immediate;   Fair Recent;   Fair Remote;   Fair  Judgement:  Fair  Insight:  Present  Psychomotor Activity:  Decreased  Concentration:  Concentration: Fair and Attention Span: Fair  Recall:  [place]: Fair  Akathisia:  No  Handed:  Right  AIMS (if indicated):  na  Assets:  Communication Skills Desire for Improvement Housing Resilience Social Support  ADL's:  Intact  Cognition: WNL  Sleep:  poor    Treatment Plan Summary:  Major depressive disorder Increase Effexor to 75 mg po qd.  Start therapy with Cordella Register who he saw for an initial assessment. Recommend that patient eat well and try to on keep himself busy with productive activities throughout the day  Insomnia Increase trazodone to 200 mg at bedtime Patient made aware of the side effects including priapism.  Return to clinic in 1 month's time or call before if necessary    Elvin So, MD 2/9/20189:26 AM

## 2017-01-21 ENCOUNTER — Other Ambulatory Visit: Payer: Self-pay | Admitting: Psychiatry

## 2017-02-11 ENCOUNTER — Ambulatory Visit: Payer: Self-pay | Admitting: Psychiatry

## 2017-03-05 ENCOUNTER — Ambulatory Visit: Payer: Self-pay | Admitting: Licensed Clinical Social Worker

## 2017-03-10 ENCOUNTER — Other Ambulatory Visit: Payer: Self-pay | Admitting: Psychiatry

## 2017-03-17 ENCOUNTER — Telehealth: Payer: Self-pay

## 2017-03-17 ENCOUNTER — Other Ambulatory Visit (HOSPITAL_COMMUNITY): Payer: Self-pay | Admitting: Psychiatry

## 2017-03-17 MED ORDER — VENLAFAXINE HCL ER 75 MG PO CP24: 75.0000 mg | ORAL_CAPSULE | Freq: Every day | ORAL | 1 refills | Status: DC

## 2017-03-17 NOTE — Telephone Encounter (Signed)
I refilled for 2 months.

## 2017-03-17 NOTE — Telephone Encounter (Signed)
pt states he was checking on the status of refill for his effexor. pt states that he can not afford to come in right now.  pt states that the doctor knows his situation and would like for me to send a message to doctor.

## 2017-03-17 NOTE — Telephone Encounter (Signed)
yes

## 2017-03-17 NOTE — Telephone Encounter (Signed)
Does he want a refill of the effexor?

## 2017-03-22 ENCOUNTER — Other Ambulatory Visit: Payer: Self-pay | Admitting: Psychiatry

## 2017-03-27 ENCOUNTER — Ambulatory Visit (INDEPENDENT_AMBULATORY_CARE_PROVIDER_SITE_OTHER): Payer: Self-pay | Admitting: Internal Medicine

## 2017-03-27 ENCOUNTER — Encounter: Payer: Self-pay | Admitting: Internal Medicine

## 2017-03-27 ENCOUNTER — Other Ambulatory Visit
Admission: RE | Admit: 2017-03-27 | Discharge: 2017-03-27 | Disposition: A | Discharge status: Home / Self Care | Payer: Self-pay | Source: Ambulatory Visit | Attending: Internal Medicine | Admitting: Internal Medicine

## 2017-03-27 ENCOUNTER — Telehealth: Payer: Self-pay | Admitting: Internal Medicine

## 2017-03-27 VITALS — BP 140/86 | HR 100 | Ht 71.0 in | Wt 179.0 lb

## 2017-03-27 DIAGNOSIS — F332 Major depressive disorder, recurrent severe without psychotic features: Secondary | ICD-10-CM

## 2017-03-27 DIAGNOSIS — I10 Essential (primary) hypertension: Secondary | ICD-10-CM

## 2017-03-27 DIAGNOSIS — G8929 Other chronic pain: Secondary | ICD-10-CM | POA: Insufficient documentation

## 2017-03-27 DIAGNOSIS — M25511 Pain in right shoulder: Secondary | ICD-10-CM

## 2017-03-27 LAB — BASIC METABOLIC PANEL
ANION GAP: 8 (ref 5–15)
BUN: 16 mg/dL (ref 6–20)
CO2: 23 mmol/L (ref 22–32)
Calcium: 9.4 mg/dL (ref 8.9–10.3)
Chloride: 103 mmol/L (ref 101–111)
Creatinine, Ser: 1.11 mg/dL (ref 0.61–1.24)
GFR calc non Af Amer: >60 mL/min (ref >60)
Glucose, Bld: 162 mg/dL — ABNORMAL HIGH (ref 65–99)
Potassium: 3.5 mmol/L (ref 3.5–5.1)
SODIUM: 134 mmol/L — AB (ref 135–145)

## 2017-03-27 LAB — CBC WITH DIFFERENTIAL/PLATELET
Basophils Absolute: 0 10*3/uL (ref 0–0.1)
Basophils Relative: 0 %
EOS ABS: 0 10*3/uL (ref 0–0.7)
Eosinophils Relative: 0 %
HCT: 44.2 % (ref 40.0–52.0)
HEMOGLOBIN: 14.9 g/dL (ref 13.0–18.0)
LYMPHS ABS: 1.4 10*3/uL (ref 1.0–3.6)
LYMPHS PCT: 10 %
MCH: 29.4 pg (ref 26.0–34.0)
MCHC: 33.7 g/dL (ref 32.0–36.0)
MCV: 87.2 fL (ref 80.0–100.0)
Monocytes Absolute: 0.6 10*3/uL (ref 0.2–1.0)
Monocytes Relative: 4 %
NEUTROS PCT: 86 %
Neutro Abs: 12.5 10*3/uL — ABNORMAL HIGH (ref 1.4–6.5)
Platelets: 311 10*3/uL (ref 150–440)
RBC: 5.07 MIL/uL (ref 4.40–5.90)
RDW: 12 % (ref 11.5–14.5)
WBC: 14.5 10*3/uL — AB (ref 3.8–10.6)

## 2017-03-27 MED ORDER — AMLODIPINE BESYLATE 10 MG PO TABS: 10.0000 mg | ORAL_TABLET | Freq: Every day | ORAL | 5 refills | Status: DC

## 2017-03-27 NOTE — Patient Instructions (Signed)
DASH Eating Plan DASH stands for "Dietary Approaches to Stop Hypertension." The DASH eating plan is a healthy eating plan that has been shown to reduce high blood pressure (hypertension). It may also reduce your risk for type 2 diabetes, heart disease, and stroke. The DASH eating plan may also help with weight loss. What are tips for following this plan? General guidelines  Avoid eating more than 2,300 mg (milligrams) of salt (sodium) a day. If you have hypertension, you may need to reduce your sodium intake to 1,500 mg a day.  Limit alcohol intake to no more than 1 drink a day for nonpregnant women and 2 drinks a day for men. One drink equals 12 oz of beer, 5 oz of wine, or 1 oz of hard liquor.  Work with your health care provider to maintain a healthy body weight or to lose weight. Ask what an ideal weight is for you.  Get at least 30 minutes of exercise that causes your heart to beat faster (aerobic exercise) most days of the week. Activities may include walking, swimming, or biking.  Work with your health care provider or diet and nutrition specialist (dietitian) to adjust your eating plan to your individual calorie needs. Reading food labels  Check food labels for the amount of sodium per serving. Choose foods with less than 5 percent of the Daily Value of sodium. Generally, foods with less than 300 mg of sodium per serving fit into this eating plan.  To find whole grains, look for the word "whole" as the first word in the ingredient list. Shopping  Buy products labeled as "low-sodium" or "no salt added."  Buy fresh foods. Avoid canned foods and premade or frozen meals. Cooking  Avoid adding salt when cooking. Use salt-free seasonings or herbs instead of table salt or sea salt. Check with your health care provider or pharmacist before using salt substitutes.  Do not fry foods. Cook foods using healthy methods such as baking, boiling, grilling, and broiling instead.  Cook with  heart-healthy oils, such as olive, canola, soybean, or sunflower oil. Meal planning   Eat a balanced diet that includes: ? 5 or more servings of fruits and vegetables each day. At each meal, try to fill half of your plate with fruits and vegetables. ? Up to 6-8 servings of whole grains each day. ? Less than 6 oz of lean meat, poultry, or fish each day. A 3-oz serving of meat is about the same size as a deck of cards. One egg equals 1 oz. ? 2 servings of low-fat dairy each day. ? A serving of nuts, seeds, or beans 5 times each week. ? Heart-healthy fats. Healthy fats called Omega-3 fatty acids are found in foods such as flaxseeds and coldwater fish, like sardines, salmon, and mackerel.  Limit how much you eat of the following: ? Canned or prepackaged foods. ? Food that is high in trans fat, such as fried foods. ? Food that is high in saturated fat, such as fatty meat. ? Sweets, desserts, sugary drinks, and other foods with added sugar. ? Full-fat dairy products.  Do not salt foods before eating.  Try to eat at least 2 vegetarian meals each week.  Eat more home-cooked food and less restaurant, buffet, and fast food.  When eating at a restaurant, ask that your food be prepared with less salt or no salt, if possible. What foods are recommended? The items listed may not be a complete list. Talk with your dietitian about what   dietary choices are best for you. Grains Whole-grain or whole-wheat bread. Whole-grain or whole-wheat pasta. Brown rice. Oatmeal. Quinoa. Bulgur. Whole-grain and low-sodium cereals. Pita bread. Low-fat, low-sodium crackers. Whole-wheat flour tortillas. Vegetables Fresh or frozen vegetables (raw, steamed, roasted, or grilled). Low-sodium or reduced-sodium tomato and vegetable juice. Low-sodium or reduced-sodium tomato sauce and tomato paste. Low-sodium or reduced-sodium canned vegetables. Fruits All fresh, dried, or frozen fruit. Canned fruit in natural juice (without  added sugar). Meat and other protein foods Skinless chicken or turkey. Ground chicken or turkey. Pork with fat trimmed off. Fish and seafood. Egg whites. Dried beans, peas, or lentils. Unsalted nuts, nut butters, and seeds. Unsalted canned beans. Lean cuts of beef with fat trimmed off. Low-sodium, lean deli meat. Dairy Low-fat (1%) or fat-free (skim) milk. Fat-free, low-fat, or reduced-fat cheeses. Nonfat, low-sodium ricotta or cottage cheese. Low-fat or nonfat yogurt. Low-fat, low-sodium cheese. Fats and oils Soft margarine without trans fats. Vegetable oil. Low-fat, reduced-fat, or light mayonnaise and salad dressings (reduced-sodium). Canola, safflower, olive, soybean, and sunflower oils. Avocado. Seasoning and other foods Herbs. Spices. Seasoning mixes without salt. Unsalted popcorn and pretzels. Fat-free sweets. What foods are not recommended? The items listed may not be a complete list. Talk with your dietitian about what dietary choices are best for you. Grains Baked goods made with fat, such as croissants, muffins, or some breads. Dry pasta or rice meal packs. Vegetables Creamed or fried vegetables. Vegetables in a cheese sauce. Regular canned vegetables (not low-sodium or reduced-sodium). Regular canned tomato sauce and paste (not low-sodium or reduced-sodium). Regular tomato and vegetable juice (not low-sodium or reduced-sodium). Pickles. Olives. Fruits Canned fruit in a light or heavy syrup. Fried fruit. Fruit in cream or butter sauce. Meat and other protein foods Fatty cuts of meat. Ribs. Fried meat. Bacon. Sausage. Bologna and other processed lunch meats. Salami. Fatback. Hotdogs. Bratwurst. Salted nuts and seeds. Canned beans with added salt. Canned or smoked fish. Whole eggs or egg yolks. Chicken or turkey with skin. Dairy Whole or 2% milk, cream, and half-and-half. Whole or full-fat cream cheese. Whole-fat or sweetened yogurt. Full-fat cheese. Nondairy creamers. Whipped toppings.  Processed cheese and cheese spreads. Fats and oils Butter. Stick margarine. Lard. Shortening. Ghee. Bacon fat. Tropical oils, such as coconut, palm kernel, or palm oil. Seasoning and other foods Salted popcorn and pretzels. Onion salt, garlic salt, seasoned salt, table salt, and sea salt. Worcestershire sauce. Tartar sauce. Barbecue sauce. Teriyaki sauce. Soy sauce, including reduced-sodium. Steak sauce. Canned and packaged gravies. Fish sauce. Oyster sauce. Cocktail sauce. Horseradish that you find on the shelf. Ketchup. Mustard. Meat flavorings and tenderizers. Bouillon cubes. Hot sauce and Tabasco sauce. Premade or packaged marinades. Premade or packaged taco seasonings. Relishes. Regular salad dressings. Where to find more information:  National Heart, Lung, and Blood Institute: www.nhlbi.nih.gov  American Heart Association: www.heart.org Summary  The DASH eating plan is a healthy eating plan that has been shown to reduce high blood pressure (hypertension). It may also reduce your risk for type 2 diabetes, heart disease, and stroke.  With the DASH eating plan, you should limit salt (sodium) intake to 2,300 mg a day. If you have hypertension, you may need to reduce your sodium intake to 1,500 mg a day.  When on the DASH eating plan, aim to eat more fresh fruits and vegetables, whole grains, lean proteins, low-fat dairy, and heart-healthy fats.  Work with your health care provider or diet and nutrition specialist (dietitian) to adjust your eating plan to your individual   calorie needs. This information is not intended to replace advice given to you by your health care provider. Make sure you discuss any questions you have with your health care provider. Document Released: 11/07/2011 Document Revised: 11/11/2016 Document Reviewed: 11/11/2016 Elsevier Interactive Patient Education  2017 [employer]  

## 2017-03-27 NOTE — Telephone Encounter (Signed)
Pt stated he was returning Nicholas [name]'s call about his lab results that were done this morning 03/27/17. Pt was advised that she and Dr. Army Melia were gone for today. Pt request a call back tomorrow if possible. Thanks TNP

## 2017-03-27 NOTE — Progress Notes (Signed)
Date:  03/27/2017   Name:  Nicholas [name]   DOB:  [birth year]   MRN:  [id number]   Chief Complaint: Hypertension Martin Majestic to doctor yesterday- [facility])) Hypertension  This is a chronic problem. The current episode started more than 1 year ago. The problem has been waxing and waning since onset. The problem is uncontrolled. Pertinent negatives include no chest pain, headaches, palpitations or shortness of breath. Past treatments include calcium channel blockers. There are no compliance problems.     Review of Systems  Constitutional: Negative for chills and fatigue.  Respiratory: Negative for cough, chest tightness, shortness of breath and wheezing.   Cardiovascular: Negative for chest pain, palpitations and leg swelling.  Musculoskeletal: Positive for arthralgias and back pain.  Neurological: Negative for dizziness and headaches.    Patient Active Problem List   Diagnosis Date Noted  . Chronic right shoulder pain 03/27/2017  . Severe episode of recurrent major depressive disorder, without psychotic features ([place]) 07/18/2016  . Generalized anxiety disorder 07/18/2016  . Failure of erection 06/17/2015  . Essential (primary) hypertension 06/17/2015  . Enlarged prostate 06/17/2015    Prior to Admission medications   Medication Sig Start Date End Date Taking? Authorizing Provider  amLODipine (NORVASC) 5 MG tablet Take 1 tablet (5 mg total) by mouth daily. 08/30/16   Glean Hess, MD  cyclobenzaprine (FLEXERIL) 10 MG tablet Take 10 mg by mouth 3 (three) times daily as needed. Reported on 05/09/2016 04/23/16   Historical Provider, MD  etodolac (LODINE) 400 MG tablet Take 400 mg by mouth 2 (two) times daily. 02/27/17   Historical Provider, MD  gabapentin (NEURONTIN) 600 MG tablet Take 600 mg by mouth at bedtime. 02/23/17   Historical Provider, MD       Historical Provider, MD       Historical Provider, MD  traZODone (DESYREL) 100 MG tablet  Take 2 tablets (200 mg total) by mouth at bedtime. 01/10/17   Himabindu Ravi, MD  venlafaxine XR (EFFEXOR-XR) 75 MG 24 hr capsule Take 1 capsule (75 mg total) by mouth daily. 03/17/17 03/17/18  Elvin So, MD    No Known Allergies  Past Surgical History:  Procedure Laterality Date  . SHOULDER OPEN ROTATOR CUFF REPAIR Right 03/2016  . shoulder surgery Right     Social History  Substance Use Topics  . Smoking status: Never Smoker  . Smokeless tobacco: Never Used  . Alcohol use No     Medication list has been reviewed and updated.   Physical Exam  Constitutional: He is oriented to person, place, and time. He appears well-developed. No distress.  HENT:  Head: Normocephalic and atraumatic.  Neck: Normal range of motion. Neck supple. Carotid bruit is not present.  Cardiovascular: Normal rate, regular rhythm and normal heart sounds.   Pulmonary/Chest: Effort normal and breath sounds normal. No respiratory distress. He has no wheezes.  Musculoskeletal: He exhibits no edema.  Neurological: He is alert and oriented to person, place, and time.  Skin: Skin is warm and dry. No rash noted.  Psychiatric: He has a normal mood and affect. His behavior is normal. Thought content normal.  Nursing note and vitals reviewed.   BP 140/86   Pulse 100   Ht 5\' 11"  (1.803 m)   Wt 179 lb (81.2 kg)   SpO2 98%   BMI 24.97 kg/m   Assessment and Plan: 1. Essential (primary) hypertension Needs med adjustment - increase amlodipine to 10 mg -  Basic metabolic panel - CBC with Differential/Platelet  2. Severe episode of recurrent major depressive disorder, without psychotic features ([place]) Followed by Psych  3. Chronic right shoulder pain Still out of work on [place] ordered this encounter  Medications  . amLODipine (NORVASC) 10 MG tablet    Sig: Take 1 tablet (10 mg total) by mouth daily.    Dispense:  30 tablet    Refill:  [place], MD [facility]  03/27/2017

## 2017-03-28 NOTE — Telephone Encounter (Signed)
Spoke to pt   Thank you

## 2017-04-01 ENCOUNTER — Telehealth: Payer: Self-pay

## 2017-04-01 NOTE — Telephone Encounter (Signed)
pt called states he needed a refill on his trazodone. pt states that you know his situation  when I told pt he needed an appt to be seen.  pt was last seen in february .

## 2017-04-03 ENCOUNTER — Other Ambulatory Visit (HOSPITAL_COMMUNITY): Payer: Self-pay | Admitting: Psychiatry

## 2017-04-03 MED ORDER — TRAZODONE HCL 100 MG PO TABS
200.0000 mg | ORAL_TABLET | Freq: Every day | ORAL | 1 refills | Status: DC
Start: 2017-04-03 — End: 2018-10-08

## 2017-04-03 NOTE — Telephone Encounter (Signed)
Prescription sent to pharmacy.

## 2017-04-12 ENCOUNTER — Other Ambulatory Visit: Payer: Self-pay | Admitting: Internal Medicine

## 2017-05-12 ENCOUNTER — Other Ambulatory Visit (HOSPITAL_COMMUNITY): Payer: Self-pay | Admitting: Psychiatry

## 2017-06-09 ENCOUNTER — Ambulatory Visit: Payer: Self-pay | Admitting: Internal Medicine

## 2017-06-09 ENCOUNTER — Other Ambulatory Visit: Payer: Self-pay | Admitting: Internal Medicine

## 2017-07-14 ENCOUNTER — Encounter: Payer: Self-pay | Admitting: Internal Medicine

## 2017-07-14 ENCOUNTER — Other Ambulatory Visit
Admission: RE | Admit: 2017-07-14 | Discharge: 2017-07-14 | Disposition: A | Discharge status: Home / Self Care | Payer: Self-pay | Source: Ambulatory Visit | Attending: Internal Medicine | Admitting: Internal Medicine

## 2017-07-14 ENCOUNTER — Ambulatory Visit (INDEPENDENT_AMBULATORY_CARE_PROVIDER_SITE_OTHER): Payer: Self-pay | Admitting: Internal Medicine

## 2017-07-14 VITALS — BP 138/78 | HR 71 | Ht 71.0 in | Wt 172.0 lb

## 2017-07-14 DIAGNOSIS — D72829 Elevated white blood cell count, unspecified: Secondary | ICD-10-CM

## 2017-07-14 DIAGNOSIS — R739 Hyperglycemia, unspecified: Secondary | ICD-10-CM

## 2017-07-14 DIAGNOSIS — I10 Essential (primary) hypertension: Secondary | ICD-10-CM

## 2017-07-14 NOTE — Progress Notes (Signed)
Date:  07/14/2017   Name:  Nicholas [name]   DOB:  27-Aug-1966   MRN:  [id number]   Chief Complaint: Hypertension; Hyperglycemia; and Abnormal Lab Hypertension  This is a chronic problem. The problem is resistant. Pertinent negatives include no chest pain, headaches, palpitations, peripheral edema or shortness of breath. Past treatments include calcium channel blockers (amlodipine increased to 10 mg last visit).  Hyperglycemia  This is a new (noted on last labs) problem. Associated symptoms include arthralgias and myalgias. Pertinent negatives include no abdominal pain, chest pain, chills or headaches. Exacerbated by: getting cortisone injections/ no fam hx of DM.  Abnormal Lab  This is a new (noted elevated WBC on last labs) problem. Associated symptoms include arthralgias and myalgias. Pertinent negatives include no abdominal pain, chest pain, chills or headaches. Exacerbated by: he has been getting frequent cortisone injections.   Lab Results  Component Value Date   WBC 14.5 (H) 03/27/2017   HGB 14.9 03/27/2017   HCT 44.2 03/27/2017   MCV 87.2 03/27/2017   PLT 311 03/27/2017   Lab Results  Component Value Date   CREATININE 1.11 03/27/2017   BUN 16 03/27/2017   NA 134 (L) 03/27/2017   K 3.5 03/27/2017   CL 103 03/27/2017   CO2 23 03/27/2017   Glucose = 162    Review of Systems  Constitutional: Negative for chills and unexpected weight change.  Respiratory: Negative for chest tightness and shortness of breath.   Cardiovascular: Negative for chest pain and palpitations.  Gastrointestinal: Negative for abdominal pain.  Musculoskeletal: Positive for arthralgias and myalgias.  Neurological: Negative for dizziness and headaches.  Psychiatric/Behavioral: Positive for dysphoric mood (improved with medication). Negative for sleep disturbance.    Patient Active Problem List   Diagnosis Date Noted  . Hyperglycemia 07/14/2017  . Leukocytosis 07/14/2017  . Chronic right  shoulder pain 03/27/2017  . Severe episode of recurrent major depressive disorder, without psychotic features ([place]) 07/18/2016  . Generalized anxiety disorder 07/18/2016  . Failure of erection 06/17/2015  . Essential (primary) hypertension 06/17/2015  . Enlarged prostate 06/17/2015    Prior to Admission medications   Medication Sig Start Date End Date Taking? Authorizing Provider  amLODipine (NORVASC) 5 MG tablet TAKE 1 TABLET (5 MG TOTAL) BY MOUTH DAILY. 04/12/17   Glean Hess, MD  cyclobenzaprine (FLEXERIL) 10 MG tablet Take 10 mg by mouth 3 (three) times daily as needed. Reported on 05/09/2016 04/23/16   [provider]  etodolac (LODINE) 400 MG tablet Take 400 mg by mouth 2 (two) times daily. 02/27/17   [provider]  gabapentin (NEURONTIN) 600 MG tablet TAKE 1 TABLET BY MOUTH AT BEDTIME 30 04/22/17   [provider]  pregabalin (LYRICA) 150 MG capsule Take 150 mg by mouth 2 (two) times daily.    [provider]  traMADol (ULTRAM) 50 MG tablet TAKE 1 TABLET BY MOUTH EVERY 6 HOURS AS NEEDED FOR PAIN, NEEDS APPT FOR FUTURE REFILLS 05/15/17   [provider]  traZODone (DESYREL) 100 MG tablet Take 2 tablets (200 mg total) by mouth at bedtime. 04/03/17   Elvin So, MD  venlafaxine XR (EFFEXOR-XR) 75 MG 24 hr capsule Take 1 capsule (75 mg total) by mouth daily. 03/17/17 03/17/18  Elvin So, MD    No Known Allergies  Past Surgical History:  Procedure Laterality Date  . SHOULDER OPEN ROTATOR CUFF REPAIR Right 03/2016  . shoulder surgery Right     Social History  Substance Use Topics  .  Smoking status: Never Smoker  . Smokeless tobacco: Never Used  . Alcohol use No   Depression screen PHQ 2/9 07/14/2017  Decreased Interest 1  Down, Depressed, Hopeless 1  PHQ - 2 Score 2  Altered sleeping 0  Tired, decreased energy 0  Change in appetite 0  Feeling bad or failure about yourself  0  Trouble concentrating 0  Moving slowly or  fidgety/restless 0  Suicidal thoughts 0  PHQ-9 Score 2  Difficult doing work/chores Somewhat difficult     Medication list has been reviewed and updated.   Physical Exam  Constitutional: He is oriented to person, place, and time. He appears well-developed. No distress.  HENT:  Head: Normocephalic and atraumatic.  Neck: Normal range of motion. Neck supple. Carotid bruit is not present. No thyromegaly present.  Cardiovascular: Normal rate and normal heart sounds.   Pulmonary/Chest: Effort normal and breath sounds normal. No respiratory distress. He has no wheezes.  Musculoskeletal: Normal range of motion.  Neurological: He is alert and oriented to person, place, and time.  Skin: Skin is warm and dry. No rash noted.  Psychiatric: He has a normal mood and affect. His behavior is normal. Thought content normal.  Nursing note and vitals reviewed.   BP 138/78   Pulse 71   Ht 5\' 11"  (1.803 m)   Wt 172 lb (78 kg)   SpO2 97%   BMI 23.99 kg/m   Assessment and Plan: 1. Essential (primary) hypertension Controlled; continue current medication  2. Hyperglycemia Rule out DM  3. Leukocytosis, unspecified type Likely due to cortisone injections - will not recheck today   No orders of the defined types were placed in this encounter.   Halina Maidens, MD [facility]  07/14/2017

## 2017-07-15 LAB — MISC LABCORP TEST (SEND OUT): Labcorp test code: 1453

## 2017-09-26 ENCOUNTER — Ambulatory Visit: Payer: Self-pay | Admitting: Internal Medicine

## 2017-10-14 ENCOUNTER — Other Ambulatory Visit: Payer: Self-pay | Admitting: Internal Medicine

## 2017-11-05 DIAGNOSIS — L218 Other seborrheic dermatitis: Secondary | ICD-10-CM | POA: Diagnosis not present

## 2017-11-05 DIAGNOSIS — L821 Other seborrheic keratosis: Secondary | ICD-10-CM | POA: Diagnosis not present

## 2017-11-05 DIAGNOSIS — L700 Acne vulgaris: Secondary | ICD-10-CM | POA: Diagnosis not present

## 2018-01-19 ENCOUNTER — Ambulatory Visit (INDEPENDENT_AMBULATORY_CARE_PROVIDER_SITE_OTHER): Payer: Medicare Other | Admitting: Internal Medicine

## 2018-01-19 ENCOUNTER — Telehealth: Payer: Self-pay

## 2018-01-19 ENCOUNTER — Encounter: Payer: Self-pay | Admitting: Internal Medicine

## 2018-01-19 ENCOUNTER — Other Ambulatory Visit: Payer: Self-pay

## 2018-01-19 VITALS — BP 124/78 | HR 86 | Ht 71.0 in | Wt 175.0 lb

## 2018-01-19 DIAGNOSIS — M25511 Pain in right shoulder: Secondary | ICD-10-CM

## 2018-01-19 DIAGNOSIS — Z1211 Encounter for screening for malignant neoplasm of colon: Secondary | ICD-10-CM

## 2018-01-19 DIAGNOSIS — I10 Essential (primary) hypertension: Secondary | ICD-10-CM

## 2018-01-19 DIAGNOSIS — N50811 Right testicular pain: Secondary | ICD-10-CM | POA: Diagnosis not present

## 2018-01-19 DIAGNOSIS — N529 Male erectile dysfunction, unspecified: Secondary | ICD-10-CM | POA: Diagnosis not present

## 2018-01-19 DIAGNOSIS — Z1159 Encounter for screening for other viral diseases: Secondary | ICD-10-CM

## 2018-01-19 DIAGNOSIS — G8929 Other chronic pain: Secondary | ICD-10-CM | POA: Diagnosis not present

## 2018-01-19 DIAGNOSIS — F332 Major depressive disorder, recurrent severe without psychotic features: Secondary | ICD-10-CM

## 2018-01-19 HISTORY — DX: Right testicular pain: N50.811

## 2018-01-19 MED ORDER — AMLODIPINE BESYLATE 10 MG PO TABS: 10.0000 mg | ORAL_TABLET | Freq: Every day | ORAL | 5 refills | Status: DC

## 2018-01-19 MED ORDER — SILDENAFIL CITRATE 20 MG PO TABS: 20.0000 mg | ORAL_TABLET | Freq: Every day | ORAL | 0 refills | Status: DC | PRN

## 2018-01-19 NOTE — Progress Notes (Signed)
Date:  01/19/2018   Name:  Nicholas [name]   DOB:  January 03, 1966   MRN:  [id number]   Chief Complaint: Annual Exam Pt now has Medicare A&B which does not cover a CPX.  He is on disability due to his shoulder and back.  He is doing physical therapy and is followed by Orthopedics and pain management.  He is overdue for screening colonoscopy.  We discussed changing to a Medicare Complete plan that would also include medications.  Hypertension  This is a chronic problem. The problem is unchanged. The problem is controlled. Pertinent negatives include no chest pain, headaches, palpitations or shortness of breath. Past treatments include calcium channel blockers.  Depression         This is a chronic problem.The problem is unchanged.  Associated symptoms include no fatigue, no appetite change, no myalgias and no headaches.  Past treatments include SNRIs - Serotonin and norepinephrine reuptake inhibitors. Groin Pain  The patient's primary symptoms include testicular pain. The patient's pertinent negatives include no genital injury, pelvic pain, penile pain, priapism or scrotal swelling. This is a new problem. The current episode started more than 1 month ago. The problem occurs constantly. The problem has been waxing and waning. The pain is mild. Pertinent negatives include no abdominal pain, chest pain, chills, constipation, diarrhea, dysuria, frequency, headaches, rash or shortness of breath. He has tried nothing for the symptoms. His past medical history is significant for erectile dysfunction.  Erectile Dysfunction  This is a chronic problem. The problem has been gradually worsening since onset. The nature of his difficulty is achieving erection and maintaining erection. Irritative symptoms do not include frequency. Pertinent negatives include no chills or dysuria. Nothing aggravates the symptoms. Past treatments include nothing.      Review of Systems  Constitutional: Negative for appetite  change, chills, diaphoresis, fatigue and unexpected weight change.  HENT: Negative for hearing loss, tinnitus, trouble swallowing and voice change.   Eyes: Negative for visual disturbance.  Respiratory: Negative for choking, shortness of breath and wheezing.   Cardiovascular: Negative for chest pain, palpitations and leg swelling.  Gastrointestinal: Negative for abdominal pain, blood in stool, constipation and diarrhea.  Genitourinary: Positive for testicular pain. Negative for difficulty urinating, dysuria, frequency, pelvic pain, penile pain and scrotal swelling.  Musculoskeletal: Positive for arthralgias. Negative for back pain and myalgias.  Skin: Negative for color change and rash.  Allergic/Immunologic: Negative for environmental allergies.  Neurological: Negative for dizziness, syncope and headaches.  Hematological: Negative for adenopathy.  Psychiatric/Behavioral: Positive for depression. Negative for dysphoric mood and sleep disturbance.    Patient Active Problem List   Diagnosis Date Noted  . Hyperglycemia 07/14/2017  . Leukocytosis 07/14/2017  . Chronic right shoulder pain 03/27/2017  . Severe episode of recurrent major depressive disorder, without psychotic features ([place]) 07/18/2016  . Generalized anxiety disorder 07/18/2016  . Failure of erection 06/17/2015  . Essential (primary) hypertension 06/17/2015  . Enlarged prostate 06/17/2015    Prior to Admission medications   Medication Sig Start Date End Date Taking? Authorizing Provider  amLODipine (NORVASC) 10 MG tablet TAKE 1 TABLET (10 MG TOTAL) BY MOUTH DAILY. 10/14/17  Yes Glean Hess, MD  cyclobenzaprine (FLEXERIL) 10 MG tablet Take 10 mg by mouth 3 (three) times daily as needed. Reported on 05/09/2016 04/23/16  Yes [provider]  etodolac (LODINE) 400 MG tablet Take 400 mg by mouth 2 (two) times daily. 02/27/17  Yes [provider]  pregabalin (LYRICA) 150 MG  capsule Take 150 mg by mouth 2 (two)  times daily.   Yes [provider]  traMADol (ULTRAM) 50 MG tablet TAKE 1 TABLET BY MOUTH EVERY 6 HOURS AS NEEDED FOR PAIN, NEEDS APPT FOR FUTURE REFILLS 05/15/17  Yes [provider]  traZODone (DESYREL) 100 MG tablet Take 2 tablets (200 mg total) by mouth at bedtime. 04/03/17  Yes Ravi, Himabindu, MD  venlafaxine XR (EFFEXOR-XR) 75 MG 24 hr capsule Take 1 capsule (75 mg total) by mouth daily. 03/17/17 03/17/18 Yes Elvin So, MD    No Known Allergies  Past Surgical History:  Procedure Laterality Date  . SHOULDER OPEN ROTATOR CUFF REPAIR Right 03/2016  . shoulder surgery Right     Social History   Tobacco Use  . Smoking status: Never Smoker  . Smokeless tobacco: Never Used  Substance Use Topics  . Alcohol use: No    Alcohol/week: 1.2 oz    Types: 2 Standard drinks or equivalent per week  . Drug use: No     Medication list has been reviewed and updated.  PHQ 2/9 Scores 01/19/2018 07/14/2017  PHQ - 2 Score 0 2  PHQ- 9 Score 0 2    Physical Exam  Constitutional: He is oriented to person, place, and time. He appears well-developed and well-nourished.  HENT:  Head: Normocephalic.  Right Ear: Tympanic membrane, external ear and ear canal normal.  Left Ear: Tympanic membrane, external ear and ear canal normal.  Nose: Nose normal.  Mouth/Throat: Uvula is midline and oropharynx is clear and moist.  Eyes: Conjunctivae and EOM are normal. Pupils are equal, round, and reactive to light.  Neck: Normal range of motion. Neck supple. Carotid bruit is not present. No thyromegaly present.  Cardiovascular: Normal rate, regular rhythm, normal heart sounds and intact distal pulses.  Pulmonary/Chest: Effort normal and breath sounds normal. He has no wheezes. Right breast exhibits no mass. Left breast exhibits no mass.  Abdominal: Soft. Normal appearance and bowel sounds are normal. There is no hepatosplenomegaly. There is no tenderness. Hernia confirmed negative in the right  inguinal area and confirmed negative in the left inguinal area.  Genitourinary: Testes normal and penis normal. Right testis shows no mass, no swelling and no tenderness. Left testis shows no mass, no swelling and no tenderness. Circumcised.  Musculoskeletal:       Right shoulder: He exhibits decreased range of motion and tenderness.       Lumbar back: He exhibits no bony tenderness and no spasm.  Lymphadenopathy:    He has no cervical adenopathy.       Right: No inguinal adenopathy present.       Left: No inguinal adenopathy present.  Neurological: He is alert and oriented to person, place, and time. He has normal reflexes.  Skin: Skin is warm, dry and intact.  Psychiatric: He has a normal mood and affect. His speech is normal and behavior is normal. Judgment and thought content normal.  Nursing note and vitals reviewed.   BP 124/78   Pulse 86   Ht 5\' 11"  (1.803 m)   Wt 175 lb (79.4 kg)   SpO2 99%   BMI 24.41 kg/m   Assessment and Plan: 1. Essential (primary) hypertension controlled  2. Chronic right shoulder pain Followed by Ortho  3. Severe episode of recurrent major depressive disorder, without psychotic features ([place]) Doing well on current medications - prescribed by pain management  4. Testicular pain, right Normal exam Advise nsiads May be related to chronic back  issues - call spine specialist to be evaluated  5. Colon cancer screening - Ambulatory referral to Gastroenterology  6. Need for hepatitis C screening test - Hepatitis C antibody  7. Erectile dysfunction, unspecified erectile dysfunction type - sildenafil (REVATIO) 20 MG tablet; Take 1-2 tablets (20-40 mg total) by mouth daily as needed.  Dispense: 10 tablet; Refill: 0  Meds ordered this encounter  Medications  . amLODipine (NORVASC) 10 MG tablet    Sig: Take 1 tablet (10 mg total) by mouth daily.    Dispense:  30 tablet    Refill:  5    Partially dictated using Editor, commissioning. Any errors are  unintentional.  Halina Maidens, MD [facility]  01/19/2018

## 2018-01-19 NOTE — Telephone Encounter (Signed)
Gastroenterology Pre-Procedure Review  Request Date: 02/16/18 Requesting Physician: Dr. Allen Norris  PATIENT REVIEW QUESTIONS: The patient responded to the following health history questions as indicated:    1. Are you having any GI issues? no 2. Do you have a personal history of Polyps? no 3. Do you have a family history of Colon Cancer or Polyps? no 4. Diabetes Mellitus? no 5. Joint replacements in the past 12 months?no 6. Major health problems in the past 3 months?no 7. Any artificial heart valves, MVP, or defibrillator?no    MEDICATIONS & ALLERGIES:    Patient reports the following regarding taking any anticoagulation/antiplatelet therapy:   Plavix, Coumadin, Eliquis, Xarelto, Lovenox, Pradaxa, Brilinta, or Effient? no Aspirin? no  Patient confirms/reports the following medications:  Current Outpatient Medications  Medication Sig Dispense Refill  . amLODipine (NORVASC) 10 MG tablet Take 1 tablet (10 mg total) by mouth daily. 30 tablet 5  . cyclobenzaprine (FLEXERIL) 10 MG tablet Take 10 mg by mouth 3 (three) times daily as needed. Reported on 05/09/2016  0  . etodolac (LODINE) 400 MG tablet Take 400 mg by mouth 2 (two) times daily.  1  . pregabalin (LYRICA) 150 MG capsule Take 150 mg by mouth 2 (two) times daily.    . sildenafil (REVATIO) 20 MG tablet Take 1-2 tablets (20-40 mg total) by mouth daily as needed. 10 tablet 0  . traMADol (ULTRAM) 50 MG tablet TAKE 1 TABLET BY MOUTH EVERY 6 HOURS AS NEEDED FOR PAIN, NEEDS APPT FOR FUTURE REFILLS  0  . traZODone (DESYREL) 100 MG tablet Take 2 tablets (200 mg total) by mouth at bedtime. 60 tablet 1  . venlafaxine XR (EFFEXOR-XR) 75 MG 24 hr capsule Take 1 capsule (75 mg total) by mouth daily. 30 capsule 1   No current facility-administered medications for this visit.     Patient confirms/reports the following allergies:  No Known Allergies  No orders of the defined types were placed in this encounter.   AUTHORIZATION INFORMATION Primary  Insurance: 1D#: Group #:  Secondary Insurance: 1D#: Group #:  SCHEDULE INFORMATION: Date: 02/16/18 Time: Location:[facility]

## 2018-01-19 NOTE — Patient Instructions (Addendum)
Call to change your insurance to a Medicare Complete plan.  Take Advil or Aleve twice a day for groin pain.

## 2018-01-20 LAB — COMPREHENSIVE METABOLIC PANEL
ALT: 20 IU/L (ref 0–44)
AST: 20 IU/L (ref 0–40)
Albumin/Globulin Ratio: 1.8 (ref 1.2–2.2)
Albumin: 4.8 g/dL (ref 3.5–5.5)
Alkaline Phosphatase: 88 IU/L (ref 39–117)
BUN/Creatinine Ratio: 6 — ABNORMAL LOW (ref 9–20)
BUN: 8 mg/dL (ref 6–24)
Bilirubin Total: 1 mg/dL (ref 0.0–1.2)
CALCIUM: 9.8 mg/dL (ref 8.7–10.2)
CO2: 23 mmol/L (ref 20–29)
CREATININE: 1.32 mg/dL — AB (ref 0.76–1.27)
Chloride: 102 mmol/L (ref 96–106)
GFR calc Af Amer: 71 mL/min/{1.73_m2} (ref >59)
GFR, EST NON AFRICAN AMERICAN: 61 mL/min/{1.73_m2} (ref >59)
GLOBULIN, TOTAL: 2.6 g/dL (ref 1.5–4.5)
Glucose: 95 mg/dL (ref 65–99)
Potassium: 4 mmol/L (ref 3.5–5.2)
SODIUM: 143 mmol/L (ref 134–144)
Total Protein: 7.4 g/dL (ref 6.0–8.5)

## 2018-01-20 LAB — CBC WITH DIFFERENTIAL/PLATELET
BASOS ABS: 0 10*3/uL (ref 0.0–0.2)
Basos: 0 %
EOS (ABSOLUTE): 0.1 10*3/uL (ref 0.0–0.4)
EOS: 1 %
HEMATOCRIT: 44.9 % (ref 37.5–51.0)
Hemoglobin: 15.1 g/dL (ref 13.0–17.7)
IMMATURE GRANULOCYTES: 0 %
Immature Grans (Abs): 0 10*3/uL (ref 0.0–0.1)
Lymphocytes Absolute: 1.8 10*3/uL (ref 0.7–3.1)
Lymphs: 24 %
MCH: 30.7 pg (ref 26.6–33.0)
MCHC: 33.6 g/dL (ref 31.5–35.7)
MCV: 91 fL (ref 79–97)
MONOCYTES: 7 %
MONOS ABS: 0.5 10*3/uL (ref 0.1–0.9)
NEUTROS PCT: 68 %
Neutrophils Absolute: 5.2 10*3/uL (ref 1.4–7.0)
Platelets: 290 10*3/uL (ref 150–379)
RBC: 4.92 x10E6/uL (ref 4.14–5.80)
RDW: 12.6 % (ref 12.3–15.4)
WBC: 7.7 10*3/uL (ref 3.4–10.8)

## 2018-01-20 LAB — LIPID PANEL
CHOLESTEROL TOTAL: 197 mg/dL (ref 100–199)
Chol/HDL Ratio: 4.4 ratio (ref 0.0–5.0)
HDL: 45 mg/dL (ref >39)
LDL CALC: 129 mg/dL — AB (ref 0–99)
TRIGLYCERIDES: 117 mg/dL (ref 0–149)
VLDL Cholesterol Cal: 23 mg/dL (ref 5–40)

## 2018-01-20 LAB — HEPATITIS C ANTIBODY

## 2018-01-20 LAB — TSH: TSH: 2.43 u[IU]/mL (ref 0.450–4.500)

## 2018-01-30 ENCOUNTER — Other Ambulatory Visit
Admission: RE | Admit: 2018-01-30 | Discharge: 2018-01-30 | Disposition: A | Discharge status: Home / Self Care | Payer: Medicare Other | Source: Ambulatory Visit | Attending: Urology | Admitting: Urology

## 2018-01-30 ENCOUNTER — Other Ambulatory Visit: Payer: Self-pay

## 2018-01-30 ENCOUNTER — Encounter: Payer: Self-pay | Admitting: Urology

## 2018-01-30 ENCOUNTER — Ambulatory Visit (INDEPENDENT_AMBULATORY_CARE_PROVIDER_SITE_OTHER): Payer: Medicare Other | Admitting: Urology

## 2018-01-30 VITALS — BP 136/71 | HR 55 | Ht 71.0 in | Wt 175.0 lb

## 2018-01-30 DIAGNOSIS — N400 Enlarged prostate without lower urinary tract symptoms: Secondary | ICD-10-CM | POA: Diagnosis not present

## 2018-01-30 DIAGNOSIS — N401 Enlarged prostate with lower urinary tract symptoms: Secondary | ICD-10-CM

## 2018-01-30 DIAGNOSIS — N529 Male erectile dysfunction, unspecified: Secondary | ICD-10-CM | POA: Insufficient documentation

## 2018-01-30 DIAGNOSIS — N138 Other obstructive and reflux uropathy: Secondary | ICD-10-CM

## 2018-01-30 DIAGNOSIS — N50819 Testicular pain, unspecified: Secondary | ICD-10-CM

## 2018-01-30 LAB — URINALYSIS, COMPLETE (UACMP) WITH MICROSCOPIC
BILIRUBIN URINE: NEGATIVE
GLUCOSE, UA: NEGATIVE mg/dL
HGB URINE DIPSTICK: NEGATIVE
KETONES UR: NEGATIVE mg/dL
LEUKOCYTES UA: NEGATIVE
NITRITE: NEGATIVE
PROTEIN: NEGATIVE mg/dL
RBC / HPF: NONE SEEN RBC/hpf (ref 0–5)
Specific Gravity, Urine: <1.005 — ABNORMAL LOW (ref 1.005–1.030)
pH: 6 (ref 5.0–8.0)

## 2018-01-30 LAB — PSA: PROSTATIC SPECIFIC ANTIGEN: 3.79 ng/mL (ref 0.00–4.00)

## 2018-01-30 NOTE — Progress Notes (Addendum)
01/30/2018 9:24 AM   Nicholas [name] 12/18/1965 [id number]  Referring provider: Glean Hess, MD [address] [place], [place] [address]  No chief complaint on file.   HPI: Patient is a 52 year old African-American male with right testicular pain who presents today for a follow-up appointment.  Right testicular pain He states that about 4 weeks ago he had the onset of right testicular pain.  He has not noted any testicular swelling, sores, trauma to the area, penile discharge or dysuria.   Patient denies any gross hematuria, dysuria or suprapubic/flank pain.  Patient denies any fevers, chills, nausea or vomiting.   His UA today is negative.   He states the pain has abated for now.    BPH WITH LUTS  (prostate and/or bladder) IPSS score: 0/1  PVR   Previous score: 12/ Previous PVR: 0 mL.  No urinary complaints.  Denies any dysuria, hematuria or suprapubic pain.   Denies any recent fevers, chills, nausea or vomiting.  He does not have a family history of PCa. IPSS    Row Name 01/30/18 0800         International Prostate Symptom Score   How often have you had the sensation of not emptying your bladder?  Not at All     How often have you had to urinate less than every two hours?  Not at All     How often have you found you stopped and started again several times when you urinated?  Not at All     How often have you found it difficult to postpone urination?  Not at All     How often have you had a weak urinary stream?  Not at All     How often have you had to strain to start urination?  Not at All     How many times did you typically get up at night to urinate?  None     Total IPSS Score  0       Quality of Life due to urinary symptoms   If you were to spend the rest of your life with your urinary condition just the way it is now how would you feel about that?  Pleased        Score:  1-7 Mild 8-19 Moderate 20-35 Severe  Erectile dysfunction His SHIM  score is 12, which is mild to moderate ED.   He has been having difficulty with erections for the last few months.  His major complaints are achieving and maintaining erections.  His libido is preserved.   His risk factors for ED are age, BPH, HTN, anxiety, depression, antidepressants and pain medication.  He denies any painful erections or curvatures with his erections.   He is no longer having spontaneous erections.  He has not tried anything in the past.  He was recently given sildenafil by his primary care physician but has not had the opportunity to take the medication. SHIM    Row Name 01/30/18 0857         SHIM: Over the last 6 months:   How do you rate your confidence that you could get and keep an erection?  Low     When you had erections with sexual stimulation, how often were your erections hard enough for penetration (entering your partner)?  Sometimes (about half the time)     During sexual intercourse, how often were you able to maintain your erection after you had penetrated (  entered) your partner?  Sometimes (about half the time)     During sexual intercourse, how difficult was it to maintain your erection to completion of intercourse?  Very Difficult     When you attempted sexual intercourse, how often was it satisfactory for you?  A Few Times (much less than half the time)       SHIM Total Score   SHIM  12        Score: 1-7 Severe ED 8-11 Moderate ED 12-16 Mild-Moderate ED 17-21 Mild ED 22-25 No ED  PMH: Past Medical History:  Diagnosis Date  . Anxiety   . BPH (benign prostatic hyperplasia)   . Depression   . ED (erectile dysfunction)   . HTN (hypertension)   . Low back pain   . Microscopic hematuria   . Nocturia   . Urgency of micturation     Surgical History: Past Surgical History:  Procedure Laterality Date  . SHOULDER OPEN ROTATOR CUFF REPAIR Right 03/2016  . shoulder surgery Right     Home Medications:  Allergies as of 01/30/2018   No Known  Allergies     Medication List        Accurate as of 01/30/18  9:24 AM. Always use your most recent med list.          amLODipine 10 MG tablet Commonly known as:  NORVASC Take 1 tablet (10 mg total) by mouth daily.   cyclobenzaprine 10 MG tablet Commonly known as:  FLEXERIL Take 10 mg by mouth 3 (three) times daily as needed. Reported on 05/09/2016   etodolac 400 MG tablet Commonly known as:  LODINE Take 400 mg by mouth 2 (two) times daily.   pregabalin 150 MG capsule Commonly known as:  LYRICA Take 150 mg by mouth 2 (two) times daily.   sildenafil 20 MG tablet Commonly known as:  REVATIO Take 1-2 tablets (20-40 mg total) by mouth daily as needed.   traMADol 50 MG tablet Commonly known as:  ULTRAM TAKE 1 TABLET BY MOUTH EVERY 6 HOURS AS NEEDED FOR PAIN, NEEDS APPT FOR FUTURE REFILLS   traZODone 100 MG tablet Commonly known as:  DESYREL Take 2 tablets (200 mg total) by mouth at bedtime.   venlafaxine XR 75 MG 24 hr capsule Commonly known as:  EFFEXOR-XR Take 1 capsule (75 mg total) by mouth daily.       Allergies: No Known Allergies  Family History: Family History  Problem Relation Age of Onset  . CAD Mother   . COPD Mother   . Lung cancer Father   . Kidney disease Neg Hx   . Prostate cancer Neg Hx     Social History:  reports that  has never smoked. he has never used smokeless tobacco. He reports that he does not drink alcohol or use drugs.  ROS: UROLOGY Frequent Urination?: No Hard to postpone urination?: No Burning/pain with urination?: No Get up at night to urinate?: No Leakage of urine?: No Urine stream starts and stops?: No Trouble starting stream?: No Do you have to strain to urinate?: No Blood in urine?: No Urinary tract infection?: No Sexually transmitted disease?: No Injury to kidneys or bladder?: No Painful intercourse?: No Weak stream?: No Erection problems?: Yes Penile pain?: No  Gastrointestinal Nausea?: No Vomiting?:  No Indigestion/heartburn?: No Diarrhea?: No Constipation?: No  Constitutional Fever: No Night sweats?: No Weight loss?: No Fatigue?: No  Skin Skin rash/lesions?: No Itching?: No  Eyes Blurred vision?: No Double vision?: No  Ears/Nose/Throat Sore throat?: No Sinus problems?: No  Hematologic/Lymphatic Swollen glands?: No Easy bruising?: No  Cardiovascular Leg swelling?: No Chest pain?: No  Respiratory Cough?: No Shortness of breath?: No  Endocrine Excessive thirst?: No  Musculoskeletal Back pain?: Yes Joint pain?: Yes  Neurological Headaches?: No Dizziness?: No  Psychologic Depression?: Yes Anxiety?: Yes  Physical Exam: BP 136/71   Pulse (!) 55   Ht 5\' 11"  (1.803 m)   Wt 175 lb (79.4 kg)   BMI 24.41 kg/m   Constitutional: Well nourished. Alert and oriented, No acute distress. HEENT:  AT, moist mucus membranes. Trachea midline, no masses. Cardiovascular: No clubbing, cyanosis, or edema. Respiratory: Normal respiratory effort, no increased work of breathing. GI: Abdomen is soft, non tender, non distended, no abdominal masses. Liver and spleen not palpable.  No hernias appreciated.  Stool sample for occult testing is not indicated.   GU: No CVA tenderness.  No bladder fullness or masses.  Patient with circumcised phallus.   Urethral meatus is patent.  No penile discharge. No penile lesions or rashes. Scrotum without lesions, cysts, rashes and/or edema.  Testicles are located scrotally bilaterally. No masses are appreciated in the testicles. Left and right epididymis are normal. Rectal: Patient with  normal sphincter tone. Anus and perineum without scarring or rashes. No rectal masses are appreciated. Prostate is approximately 55 grams, median sulcus, no nodules are appreciated. Seminal vesicles are normal. Skin: No rashes, bruises or suspicious lesions. Lymph: No cervical or inguinal adenopathy. Neurologic: Grossly intact, no focal deficits, moving all  4 extremities. Psychiatric: Normal mood and affect.   Laboratory Data: PSA History  2.4 in 01/2013  35.9 in 05/2016  3.79 in 01/2018 Lab Results  Component Value Date   WBC 7.7 01/19/2018   HGB 15.1 01/19/2018   HCT 44.9 01/19/2018   MCV 91 01/19/2018   PLT 290 01/19/2018    Lab Results  Component Value Date   CREATININE 1.32 (H) 01/19/2018    Lab Results  Component Value Date   PSA 2.5 02/15/2013     Lab Results  Component Value Date   TSH 2.430 01/19/2018       Component Value Date/Time   CHOL 197 01/19/2018 1016   HDL 45 01/19/2018 1016   CHOLHDL 4.4 01/19/2018 1016   LDLCALC 129 (H) 01/19/2018 1016    Results for orders placed or performed in visit on 01/19/18  CBC with Differential/Platelet  Result Value Ref Range   WBC 7.7 3.4 - 10.8 x10E3/uL   RBC 4.92 4.14 - 5.80 x10E6/uL   Hemoglobin 15.1 13.0 - 17.7 g/dL   Hematocrit 44.9 37.5 - 51.0 %   MCV 91 79 - 97 fL   MCH 30.7 26.6 - 33.0 pg   MCHC 33.6 31.5 - 35.7 g/dL   RDW 12.6 12.3 - 15.4 %   Platelets 290 150 - 379 x10E3/uL   Neutrophils 68 Not Estab. %   Lymphs 24 Not Estab. %   Monocytes 7 Not Estab. %   Eos 1 Not Estab. %   Basos 0 Not Estab. %   Neutrophils Absolute 5.2 1.4 - 7.0 x10E3/uL   Lymphocytes Absolute 1.8 0.7 - 3.1 x10E3/uL   Monocytes Absolute 0.5 0.1 - 0.9 x10E3/uL   EOS (ABSOLUTE) 0.1 0.0 - 0.4 x10E3/uL   Basophils Absolute 0.0 0.0 - 0.2 x10E3/uL   Immature Granulocytes 0 Not Estab. %   Immature Grans (Abs) 0.0 0.0 - 0.1 x10E3/uL  Comprehensive metabolic panel  Result Value Ref Range  Glucose 95 65 - 99 mg/dL   BUN 8 6 - 24 mg/dL   Creatinine, Ser 1.32 (H) 0.76 - 1.27 mg/dL   GFR calc non Af Amer 61 >59 mL/min/1.73   GFR calc Af Amer 71 >59 mL/min/1.73   BUN/Creatinine Ratio 6 (L) 9 - 20   Sodium 143 134 - 144 mmol/L   Potassium 4.0 3.5 - 5.2 mmol/L   Chloride 102 96 - 106 mmol/L   CO2 23 20 - 29 mmol/L   Calcium 9.8 8.7 - 10.2 mg/dL   Total Protein 7.4 6.0 - 8.5  g/dL   Albumin 4.8 3.5 - 5.5 g/dL   Globulin, Total 2.6 1.5 - 4.5 g/dL   Albumin/Globulin Ratio 1.8 1.2 - 2.2   Bilirubin Total 1.0 0.0 - 1.2 mg/dL   Alkaline Phosphatase 88 39 - 117 IU/L   AST 20 0 - 40 IU/L   ALT 20 0 - 44 IU/L  Hepatitis C antibody  Result Value Ref Range   Hep C Virus Ab <0.1 0.0 - 0.9 s/co ratio  Lipid panel  Result Value Ref Range   Cholesterol, Total 197 100 - 199 mg/dL   Triglycerides 117 0 - 149 mg/dL   HDL 45 >39 mg/dL   VLDL Cholesterol Cal 23 5 - 40 mg/dL   LDL Calculated 129 (H) 0 - 99 mg/dL   Chol/HDL Ratio 4.4 0.0 - 5.0 ratio  TSH  Result Value Ref Range   TSH 2.430 0.450 - 4.500 uIU/mL   Pertinent Imaging: N/A.  See Epic.    Assessment & Plan:    1. Right testicular pain - Urinalysis, Complete - Resolved  2. Erectile dysfunction  - SHIM score is 12  - I explained to the patient that in order to achieve an erection it takes good functioning of the nervous system (parasympathetic and rs, sympathetic, sensory and motor), good blood flow into the erectile tissue of the penis and a desire to have sex  - I explained that conditions like diabetes, hypertension, coronary artery disease, peripheral vascular disease, smoking, alcohol consumption, age, sleep apnea and BPH can diminish the ability to have an erection  - A recent study published in Sex Med 2018 Apr 13 revealed moderate to vigorous aerobic exercise for 40 minutes 4 times per week can decrease erectile problems caused by physical inactivity, obesity, hypertension, metabolic syndrome and/or cardiovascular diseases  - continue sildenafil  - RTC pending PSA  3. BPH with LUTS  - IPSS score is 0/1, it is improving   - Continue conservative management, avoiding bladder irritants and timed voiding's  - RTC pending PSA   Return for pending PSA.  These notes generated with voice recognition software. I apologize for typographical errors.  Zara Council, [facility] [address] [place], [place] [address] [phone]

## 2018-02-06 ENCOUNTER — Telehealth: Payer: Self-pay | Admitting: Urology

## 2018-02-06 NOTE — Telephone Encounter (Signed)
Pt called asking about labs from last Friday.  Can you please give pt a call?  He was told results would be back Monday.

## 2018-02-10 ENCOUNTER — Encounter: Payer: Self-pay | Admitting: *Deleted

## 2018-02-10 ENCOUNTER — Other Ambulatory Visit: Payer: Self-pay

## 2018-02-13 NOTE — Telephone Encounter (Signed)
LMOM

## 2018-02-13 NOTE — Discharge Instructions (Signed)
General Anesthesia, Adult, Care After °These instructions provide you with information about caring for yourself after your procedure. Your health care provider may also give you more specific instructions. Your treatment has been planned according to current medical practices, but problems sometimes occur. Call your health care provider if you have any problems or questions after your procedure. °What can I expect after the procedure? °After the procedure, it is common to have: °· Vomiting. °· A sore throat. °· Mental slowness. ° °It is common to feel: °· Nauseous. °· Cold or shivery. °· Sleepy. °· Tired. °· Sore or achy, even in parts of your body where you did not have surgery. ° °Follow these instructions at home: °For at least 24 hours after the procedure: °· Do not: °? Participate in activities where you could fall or become injured. °? Drive. °? Use heavy machinery. °? Drink alcohol. °? Take sleeping pills or medicines that cause drowsiness. °? Make important decisions or sign legal documents. °? Take care of children on your own. °· Rest. °Eating and drinking °· If you vomit, drink water, juice, or soup when you can drink without vomiting. °· Drink enough fluid to keep your urine clear or pale yellow. °· Make sure you have little or no nausea before eating solid foods. °· Follow the diet recommended by your health care provider. °General instructions °· Have a responsible adult stay with you until you are awake and alert. °· Return to your normal activities as told by your health care provider. Ask your health care provider what activities are safe for you. °· Take over-the-counter and prescription medicines only as told by your health care provider. °· If you smoke, do not smoke without supervision. °· Keep all follow-up visits as told by your health care provider. This is important. °Contact a health care provider if: °· You continue to have nausea or vomiting at home, and medicines are not helpful. °· You  cannot drink fluids or start eating again. °· You cannot urinate after 8-12 hours. °· You develop a skin rash. °· You have fever. °· You have increasing redness at the site of your procedure. °Get help right away if: °· You have difficulty breathing. °· You have chest pain. °· You have unexpected bleeding. °· You feel that you are having a life-threatening or urgent problem. °This information is not intended to replace advice given to you by your health care provider. Make sure you discuss any questions you have with your health care provider. °Document Released: 02/24/2001 Document Revised: 04/22/2016 Document Reviewed: 11/02/2015 °Elsevier Interactive Patient Education © 2018 [employer] ° °

## 2018-02-16 ENCOUNTER — Ambulatory Visit: Payer: Medicare Other | Admitting: Student in an Organized Health Care Education/Training Program

## 2018-02-16 ENCOUNTER — Ambulatory Visit
Admission: RE | Admit: 2018-02-16 | Discharge: 2018-02-16 | Disposition: A | Discharge status: Home / Self Care | Payer: Medicare Other | Source: Ambulatory Visit | Attending: Gastroenterology | Admitting: Gastroenterology

## 2018-02-16 ENCOUNTER — Encounter
Admission: RE | Disposition: A | Discharge status: Home / Self Care | Payer: Self-pay | Source: Ambulatory Visit | Attending: Gastroenterology

## 2018-02-16 DIAGNOSIS — K640 First degree hemorrhoids: Secondary | ICD-10-CM | POA: Insufficient documentation

## 2018-02-16 DIAGNOSIS — Z79899 Other long term (current) drug therapy: Secondary | ICD-10-CM | POA: Diagnosis not present

## 2018-02-16 DIAGNOSIS — F329 Major depressive disorder, single episode, unspecified: Secondary | ICD-10-CM | POA: Diagnosis not present

## 2018-02-16 DIAGNOSIS — F419 Anxiety disorder, unspecified: Secondary | ICD-10-CM | POA: Insufficient documentation

## 2018-02-16 DIAGNOSIS — Z1211 Encounter for screening for malignant neoplasm of colon: Secondary | ICD-10-CM | POA: Diagnosis not present

## 2018-02-16 DIAGNOSIS — I10 Essential (primary) hypertension: Secondary | ICD-10-CM | POA: Insufficient documentation

## 2018-02-16 DIAGNOSIS — N400 Enlarged prostate without lower urinary tract symptoms: Secondary | ICD-10-CM | POA: Insufficient documentation

## 2018-02-16 HISTORY — PX: COLONOSCOPY WITH PROPOFOL: SHX5780

## 2018-02-16 SURGERY — COLONOSCOPY WITH PROPOFOL
Anesthesia: General | Wound class: Contaminated

## 2018-02-16 MED ORDER — SODIUM CHLORIDE 0.9 % IV SOLN: INTRAVENOUS | Status: DC

## 2018-02-16 MED ORDER — LACTATED RINGERS IV SOLN
INTRAVENOUS | Status: DC
  Administered 2018-02-16: 07:00:00 via INTRAVENOUS

## 2018-02-16 MED ORDER — PROPOFOL 10 MG/ML IV BOLUS
INTRAVENOUS | Status: DC | PRN
  Administered 2018-02-16: 100 mg via INTRAVENOUS
  Administered 2018-02-16: 50 mg via INTRAVENOUS
  Administered 2018-02-16: 20 mg via INTRAVENOUS
  Administered 2018-02-16: 50 mg via INTRAVENOUS
  Administered 2018-02-16: 30 mg via INTRAVENOUS

## 2018-02-16 MED ORDER — STERILE WATER FOR IRRIGATION IR SOLN
Status: DC | PRN
  Administered 2018-02-16: 09:00:00

## 2018-02-16 MED ORDER — LIDOCAINE HCL (CARDIAC) 20 MG/ML IV SOLN
INTRAVENOUS | Status: DC | PRN
  Administered 2018-02-16: 30 mg via INTRAVENOUS

## 2018-02-16 SURGICAL SUPPLY — 24 items

## 2018-02-16 NOTE — Anesthesia Postprocedure Evaluation (Signed)
Anesthesia Post Note  Patient: Nicholas [name]  Procedure(s) Performed: COLONOSCOPY WITH PROPOFOL (N/A )  Patient location during evaluation: PACU Anesthesia Type: General Level of consciousness: awake and alert Pain management: pain level controlled Vital Signs Assessment: post-procedure vital signs reviewed and stable Respiratory status: spontaneous breathing, nonlabored ventilation, respiratory function stable and patient connected to nasal cannula oxygen Cardiovascular status: blood pressure returned to baseline and stable Postop Assessment: no apparent nausea or vomiting Anesthetic complications: no    Alisa Graff

## 2018-02-16 NOTE — Transfer of Care (Signed)
Immediate Anesthesia Transfer of Care Note  Patient: Nicholas [name]  Procedure(s) Performed: COLONOSCOPY WITH PROPOFOL (N/A )  Patient Location: PACU  Anesthesia Type: General  Level of Consciousness: awake, alert  and patient cooperative  Airway and Oxygen Therapy: Patient Spontanous Breathing and Patient connected to supplemental oxygen  Post-op Assessment: Post-op Vital signs reviewed, Patient's Cardiovascular Status Stable, Respiratory Function Stable, Patent Airway and No signs of Nausea or vomiting  Post-op Vital Signs: Reviewed and stable  Complications: No apparent anesthesia complications

## 2018-02-16 NOTE — H&P (Signed)
Nicholas Lame, MD [facility] [address], [place], [place] [address] Phone: [phone] Fax : [phone]  Primary Care Physician:  Glean Hess, MD Primary Gastroenterologist:  Dr. Allen Norris  Pre-Procedure History & Physical: HPI:  Nicholas [name] is a 52 y.o. male is here for a screening colonoscopy.   Past Medical History:  Diagnosis Date  . Anxiety   . BPH (benign prostatic hyperplasia)   . Depression   . ED (erectile dysfunction)   . HTN (hypertension)   . Low back pain   . Microscopic hematuria   . Nocturia   . Urgency of micturation     Past Surgical History:  Procedure Laterality Date  . SHOULDER OPEN ROTATOR CUFF REPAIR Right 03/2016  . shoulder surgery Right     Prior to Admission medications   Medication Sig Start Date End Date Taking? Authorizing Provider  amLODipine (NORVASC) 10 MG tablet Take 1 tablet (10 mg total) by mouth daily. 01/19/18  Yes Glean Hess, MD  cyclobenzaprine (FLEXERIL) 10 MG tablet Take 10 mg by mouth 3 (three) times daily as needed. Reported on 05/09/2016 04/23/16  Yes [provider]  etodolac (LODINE) 400 MG tablet Take 400 mg by mouth 2 (two) times daily. 02/27/17  Yes [provider]  pregabalin (LYRICA) 150 MG capsule Take 150 mg by mouth 2 (two) times daily.   Yes [provider]  sildenafil (REVATIO) 20 MG tablet Take 1-2 tablets (20-40 mg total) by mouth daily as needed. 01/19/18  Yes Glean Hess, MD  traMADol (ULTRAM) 50 MG tablet TAKE 1 TABLET BY MOUTH EVERY 6 HOURS AS NEEDED FOR PAIN, NEEDS APPT FOR FUTURE REFILLS 05/15/17  Yes [provider]  traZODone (DESYREL) 100 MG tablet Take 2 tablets (200 mg total) by mouth at bedtime. 04/03/17  Yes Ravi, Himabindu, MD  venlafaxine XR (EFFEXOR-XR) 75 MG 24 hr capsule Take 1 capsule (75 mg total) by mouth daily. 03/17/17 03/17/18 Yes Elvin So, MD    Allergies as of 01/19/2018  . (No Known Allergies)    Family History  Problem  Relation Age of Onset  . CAD Mother   . COPD Mother   . Lung cancer Father   . Kidney disease Neg Hx   . Prostate cancer Neg Hx     Social History   Socioeconomic History  . Marital status: Married    Spouse name: Not on file  . Number of children: Not on file  . Years of education: Not on file  . Highest education level: Not on file  Social Needs  . Financial resource strain: Not on file  . Food insecurity - worry: Not on file  . Food insecurity - inability: Not on file  . Transportation needs - medical: Not on file  . Transportation needs - non-medical: Not on file  Occupational History  . Not on file  Tobacco Use  . Smoking status: Never Smoker  . Smokeless tobacco: Never Used  Substance and Sexual Activity  . Alcohol use: No    Alcohol/week: 1.2 oz    Types: 2 Standard drinks or equivalent per week  . Drug use: No  . Sexual activity: Yes    Birth control/protection: None  Other Topics Concern  . Not on file  Social History Narrative  . Not on file    Review of Systems: See HPI, otherwise negative ROS  Physical Exam: BP 134/83   Pulse 75   Temp 98.2 F (36.8 C) (Temporal)  Resp 16   Ht 5\' 11"  (1.803 m)   Wt 168 lb (76.2 kg)   SpO2 100%   BMI 23.43 kg/m  General:   Alert,  pleasant and cooperative in NAD Head:  Normocephalic and atraumatic. Neck:  Supple; no masses or thyromegaly. Lungs:  Clear throughout to auscultation.    Heart:  Regular rate and rhythm. Abdomen:  Soft, nontender and nondistended. Normal bowel sounds, without guarding, and without rebound.   Neurologic:  Alert and  oriented x4;  grossly normal neurologically.  Impression/Plan: Nicholas [name] is now here to undergo a screening colonoscopy.  Risks, benefits, and alternatives regarding colonoscopy have been reviewed with the patient.  Questions have been answered.  All parties agreeable.

## 2018-02-16 NOTE — Op Note (Signed)
[facility] Gastroenterology Patient Name: Nicholas [name] Procedure Date: 02/16/2018 8:16 AM MRN: [id number] Account #: [id number] Date of Birth: 07/18/1966 Admit Type: Outpatient Age: 52 Room: [facility] OR ROOM 01 Gender: Male Note Status: Finalized Procedure:            Colonoscopy Indications:          Screening for colorectal malignant neoplasm Providers:            Lucilla Lame MD, MD Referring MD:         Halina Maidens, MD (Referring MD) Medicines:            Propofol per Anesthesia Complications:        No immediate complications. Procedure:            Pre-Anesthesia Assessment:                       - Prior to the procedure, a History and Physical was                        performed, and patient medications and allergies were                        reviewed. The patient's tolerance of previous                        anesthesia was also reviewed. The risks and benefits of                        the procedure and the sedation options and risks were                        discussed with the patient. All questions were                        answered, and informed consent was obtained. Prior                        Anticoagulants: The patient has taken no previous                        anticoagulant or antiplatelet agents. ASA Grade                        Assessment: II - A patient with mild systemic disease.                        After reviewing the risks and benefits, the patient was                        deemed in satisfactory condition to undergo the                        procedure.                       After obtaining informed consent, the colonoscope was                        passed under direct vision. Throughout the procedure,  the patient's blood pressure, pulse, and oxygen                        saturations were monitored continuously. The Olympus                        Colonoscope [id number]) was introduced through the                      anus and advanced to the the cecum, identified by                        appendiceal orifice and ileocecal valve. The                        colonoscopy was performed without difficulty. The                        patient tolerated the procedure well. The quality of                        the bowel preparation was excellent. Findings:      The perianal and digital rectal examinations were normal.      Non-bleeding internal hemorrhoids were found during retroflexion. The       hemorrhoids were Grade I (internal hemorrhoids that do not prolapse). Impression:           - Non-bleeding internal hemorrhoids.                       - No specimens collected. Recommendation:       - Discharge patient to home.                       - Resume previous diet.                       - Continue present medications.                       - Repeat colonoscopy in 10 years for screening unless                        any change in family history or lower GI problems. Procedure Code(s):    --- Professional ---                       [phone], Colonoscopy, flexible; diagnostic, including                        collection of specimen(s) by brushing or washing, when                        performed (separate procedure) Diagnosis Code(s):    --- Professional ---                       Z12.11, Encounter for screening for malignant neoplasm                        of colon CPT copyright 2016 American Medical Association. All rights reserved. The codes documented in this report are preliminary and upon coder review may  be revised to meet current compliance requirements. Lucilla Lame MD, MD 02/16/2018 8:40:04 AM This report has been signed electronically. Number of Addenda: 0 Note Initiated On: 02/16/2018 8:16 AM Scope Withdrawal Time: 0 hours 8 minutes 11 seconds  Total Procedure Duration: 0 hours 10 minutes 52 seconds       [facility]

## 2018-02-16 NOTE — Anesthesia Procedure Notes (Signed)
Date/Time: 02/16/2018 8:23 AM Performed by: Cameron Ali, CRNA Pre-anesthesia Checklist: Patient identified, Emergency Drugs available, Suction available, Timeout performed and Patient being monitored Patient Re-evaluated:Patient Re-evaluated prior to induction Oxygen Delivery Method: Nasal cannula Placement Confirmation: positive ETCO2

## 2018-02-16 NOTE — Anesthesia Preprocedure Evaluation (Signed)
Anesthesia Evaluation  Patient identified by MRN, date of birth, ID band Patient awake    Reviewed: Allergy & Precautions, H&P , NPO status , Patient's Chart, lab work & pertinent test results, reviewed documented beta blocker date and time   Airway Mallampati: II  TM Distance: >3 FB Neck ROM: full    Dental no notable dental hx.    Pulmonary neg pulmonary ROS,    Pulmonary exam normal breath sounds clear to auscultation       Cardiovascular Exercise Tolerance: Good hypertension,  Rhythm:regular Rate:Normal     Neuro/Psych Anxiety Depression negative neurological ROS     GI/Hepatic negative GI ROS, Neg liver ROS,   Endo/Other  negative endocrine ROS  Renal/GU negative Renal ROS  negative genitourinary   Musculoskeletal   Abdominal   Peds  Hematology negative hematology ROS (+)   Anesthesia Other Findings   Reproductive/Obstetrics negative OB ROS                             Anesthesia Physical Anesthesia Plan  ASA: II  Anesthesia Plan: General   Post-op Pain Management:    Induction:   PONV Risk Score and Plan:   Airway Management Planned:   Additional Equipment:   Intra-op Plan:   Post-operative Plan:   Informed Consent: I have reviewed the patients History and Physical, chart, labs and discussed the procedure including the risks, benefits and alternatives for the proposed anesthesia with the patient or authorized representative who has indicated his/her understanding and acceptance.   Dental Advisory Given  Plan Discussed with: CRNA  Anesthesia Plan Comments:         Anesthesia Quick Evaluation

## 2018-02-17 ENCOUNTER — Encounter: Payer: Self-pay | Admitting: Gastroenterology

## 2018-02-27 ENCOUNTER — Other Ambulatory Visit: Payer: Self-pay | Admitting: Internal Medicine

## 2018-02-27 DIAGNOSIS — N529 Male erectile dysfunction, unspecified: Secondary | ICD-10-CM

## 2018-03-10 ENCOUNTER — Other Ambulatory Visit: Payer: Self-pay | Admitting: Internal Medicine

## 2018-03-10 DIAGNOSIS — N529 Male erectile dysfunction, unspecified: Secondary | ICD-10-CM

## 2018-03-24 ENCOUNTER — Other Ambulatory Visit: Payer: Self-pay | Admitting: Internal Medicine

## 2018-03-24 DIAGNOSIS — N529 Male erectile dysfunction, unspecified: Secondary | ICD-10-CM

## 2018-04-04 ENCOUNTER — Other Ambulatory Visit: Payer: Self-pay | Admitting: Internal Medicine

## 2018-04-04 DIAGNOSIS — N529 Male erectile dysfunction, unspecified: Secondary | ICD-10-CM

## 2018-04-10 ENCOUNTER — Other Ambulatory Visit: Payer: Self-pay | Admitting: Internal Medicine

## 2018-04-10 DIAGNOSIS — N529 Male erectile dysfunction, unspecified: Secondary | ICD-10-CM

## 2018-04-30 ENCOUNTER — Other Ambulatory Visit: Payer: Self-pay | Admitting: Internal Medicine

## 2018-04-30 DIAGNOSIS — N529 Male erectile dysfunction, unspecified: Secondary | ICD-10-CM

## 2018-06-29 ENCOUNTER — Telehealth: Payer: Self-pay | Admitting: Internal Medicine

## 2018-06-29 NOTE — Telephone Encounter (Signed)
Called to schedule Medicare Annual Wellness Visit with Nurse Health Advisor. If patient returns call, please schedule AWV with NHA any date  °Thank you! °For any questions please contact: °Kathryn Brown [phone]  °Or Skype me at: [email or web address]  ° ° °

## 2018-07-02 ENCOUNTER — Telehealth: Payer: Self-pay | Admitting: Internal Medicine

## 2018-07-02 NOTE — Telephone Encounter (Signed)
Called to schedule Medicare Annual Wellness Visit with Nurse Health Advisor. If patient returns call, please  schedule AWV - I  with NHA any date after    Thank you! For any questions please contact: Jill Alexanders [phone]  Or Skype me at: [email or web address]

## 2018-07-15 ENCOUNTER — Ambulatory Visit (INDEPENDENT_AMBULATORY_CARE_PROVIDER_SITE_OTHER): Payer: Medicare Other

## 2018-07-15 VITALS — BP 110/68 | HR 81 | Temp 97.6°F | Resp 12 | Ht 71.0 in | Wt 176.6 lb

## 2018-07-15 DIAGNOSIS — Z0000 Encounter for general adult medical examination without abnormal findings: Secondary | ICD-10-CM

## 2018-07-15 NOTE — Progress Notes (Signed)
Subjective:   Nicholas [name] is a 52 y.o. male who presents for an Initial Medicare Annual Wellness Visit.  Review of Systems  N/A Cardiac Risk Factors include: advanced age (>18men, >57 women);hypertension;male gender;sedentary lifestyle    Objective:    Today's Vitals   07/15/18 0945  BP: 110/68  Pulse: 81  Resp: 12  Temp: 97.6 F (36.4 C)  TempSrc: Oral  SpO2: 98%  Weight: 176 lb 9.6 oz (80.1 kg)  Height: 5\' 11"  (1.803 m)   Body mass index is 24.63 kg/m.  Advanced Directives 07/15/2018 02/16/2018 10/10/2016  Does Patient Have a Medical Advance Directive? No No No  Would patient like information on creating a medical advance directive? Yes (MAU/Ambulatory/Procedural Areas - Information given) No - Patient declined No - patient declined information  Some encounter information is confidential and restricted. Go to Review Flowsheets activity to see all data.    Current Medications (verified) Outpatient Encounter Medications as of 07/15/2018  Medication Sig  . amLODipine (NORVASC) 10 MG tablet Take 1 tablet (10 mg total) by mouth daily.  . cyclobenzaprine (FLEXERIL) 10 MG tablet Take 10 mg by mouth 3 (three) times daily as needed. Reported on 05/09/2016  . etodolac (LODINE) 400 MG tablet Take 400 mg by mouth 2 (two) times daily.  . pregabalin (LYRICA) 150 MG capsule Take 150 mg by mouth 2 (two) times daily.  . sildenafil (REVATIO) 20 MG tablet TAKE 1-2 TABLETS BY MOUTH EVERY DAY AS NEEDED  . traMADol (ULTRAM) 50 MG tablet TAKE 1 TABLET BY MOUTH EVERY 6 HOURS AS NEEDED FOR PAIN, NEEDS APPT FOR FUTURE REFILLS  . traZODone (DESYREL) 100 MG tablet Take 2 tablets (200 mg total) by mouth at bedtime.  Marland Kitchen venlafaxine XR (EFFEXOR-XR) 75 MG 24 hr capsule Take 1 capsule (75 mg total) by mouth daily.   No facility-administered encounter medications on file as of 07/15/2018.     Allergies (verified) Patient has no known allergies.   History: Past Medical History:  Diagnosis Date    . Anxiety   . BPH (benign prostatic hyperplasia)   . Depression   . ED (erectile dysfunction)   . HTN (hypertension)   . Low back pain   . Microscopic hematuria   . Nocturia   . Urgency of micturation    Past Surgical History:  Procedure Laterality Date  . COLONOSCOPY WITH PROPOFOL N/A 02/16/2018   Procedure: COLONOSCOPY WITH PROPOFOL;  Surgeon: Lucilla Lame, MD;  Location: [facility];  Service: Endoscopy;  Laterality: N/A;  . SHOULDER OPEN ROTATOR CUFF REPAIR Right 03/2016  . shoulder surgery Right    Family History  Problem Relation Age of Onset  . CAD Mother   . COPD Mother   . Lung cancer Father   . Healthy Sister   . Kidney disease Neg Hx   . Prostate cancer Neg Hx    Social History   Socioeconomic History  . Marital status: Single    Spouse name: Not on file  . Number of children: 2  . Years of education: some college  . Highest education level: 12th grade  Occupational History  . Occupation: Disabled  Social Needs  . Financial resource strain: Not hard at all  . Food insecurity:    Worry: Never true    Inability: Never true  . Transportation needs:    Medical: No    Non-medical: No  Tobacco Use  . Smoking status: Never Smoker  . Smokeless tobacco: Never Used  . Tobacco  comment: smoking cessation materials not required  Substance and Sexual Activity  . Alcohol use: No  . Drug use: No  . Sexual activity: Yes    Birth control/protection: None  Lifestyle  . Physical activity:    Days per week: 3 days    Minutes per session: 60 min  . Stress: Not at all  Relationships  . Social connections:    Talks on phone: Patient refused    Gets together: Patient refused    Attends religious service: Patient refused    Active member of club or organization: Patient refused    Attends meetings of clubs or organizations: Patient refused    Relationship status: Patient refused  Other Topics Concern  . Not on file  Social History Narrative  . Not on file    Tobacco Counseling Counseling given: No Comment: smoking cessation materials not required  Clinical Intake:  Pre-visit preparation completed: Yes  Pain : No/denies pain   BMI - recorded: 24.63 Nutritional Status: BMI of 19-24  Normal Nutritional Risks: None Diabetes: No  How often do you need to have someone help you when you read instructions, pamphlets, or other written materials from your doctor or pharmacy?: 1 - Never  Interpreter Needed?: No  Information entered by :: AEversole, LPN  Activities of Daily Living In your present state of health, do you have any difficulty performing the following activities: 07/15/2018 02/16/2018  Hearing? N N  Comment denies hearing aids -  Vision? N N  Comment denies eyeglasses -  Difficulty concentrating or making decisions? N N  Walking or climbing stairs? Y N  Comment back pain -  Dressing or bathing? N N  Doing errands, shopping? N -  Preparing Food and eating ? N -  Comment denies dentures -  Using the Toilet? N -  In the past six months, have you accidently leaked urine? N -  Do you have problems with loss of bowel control? N -  Managing your Medications? N -  Managing your Finances? N -  Housekeeping or managing your Housekeeping? N -  Some recent data might be hidden     Immunizations and Health Maintenance  There is no immunization history on file for this patient. There are no preventive care reminders to display for this patient.  Patient Care Team: Glean Hess, MD as PCP - General (Internal Medicine) [facility] spine and pain as Consulting Physician Abbie Sons, MD as Consulting Physician (Psychiatry) Pa, [facility] as Consulting Physician (Orthopedic Surgery) Bufford Buttner, MD as Consulting Physician (Orthopedic Surgery)  Indicate any recent [place] you may have received from other than [facility] providers in the past year (date may be approximate).    Assessment:   This is a  routine wellness examination for Nicholas [name].  Hearing/Vision screen Vision Screening Comments: Not established with a provider for annual eye exams  Dietary issues and exercise activities discussed: Current Exercise Habits: Home exercise routine, Type of exercise: strength training/weights, Time (Minutes): 60, Frequency (Times/Week): 3, Weekly Exercise (Minutes/Week): 180, Intensity: Mild, Exercise limited by: None identified  Goals    . DIET - INCREASE WATER INTAKE     Recommend to drink at least 6-8 8oz glasses of water per day.      Depression Screen PHQ 2/9 Scores 07/15/2018 01/19/2018 07/14/2017  PHQ - 2 Score 2 0 2  PHQ- 9 Score 2 0 2    Fall Risk Fall Risk  07/15/2018  Falls in the past year? No  Risk for fall due to : Medication side effect    FALL RISK PREVENTION PERTAINING TO HOME: Is your home free of loose throw rugs in walkways, pet beds, electrical cords, etc? Yes Is there adequate lighting in your home to reduce risk of falls?  Yes Are there stairs in or around your home WITH handrails? Yes  ASSISTIVE DEVICES UTILIZED TO PREVENT FALLS: Use of a cane, walker or w/c? No Grab bars in the bathroom? Yes  Shower chair or a place to sit while bathing? No An elevated toilet seat or a handicapped toilet? No  Timed Get Up and Go Performed: Yes. Pt ambulated 10 feet within 7 sec. Gait stead-fast and without the use of an assistive device. No intervention required at this time. Fall risk prevention has been discussed.  Community Resource Referral:  Pt declined my offer to send [employer] Referral to Care Guide for a shower chair or an elevated toilet seat.  Cognitive Function:     6CIT Screen 07/15/2018  What Year? 0 points  What month? 0 points  What time? 0 points  Count back from 20 0 points  Months in reverse 0 points  Repeat phrase 4 points  Total Score 4    Screening Tests Health Maintenance  Topic Date Due  . INFLUENZA VACCINE  10/02/2018 (Originally  07/02/2018)  . TETANUS/TDAP  01/19/2019 (Originally 10/22/1983)  . HIV Screening  01/19/2019 (Originally 10/22/1979)  . COLONOSCOPY  02/17/2028  . Hepatitis C Screening  Completed    Qualifies for Shingles Vaccine? Yes. Due for Shingrix. Education has been provided regarding the importance of this vaccine. Pt has been advised to call insurance company to determine out of pocket expense. Advised may also receive vaccine at local pharmacy or Health Dept. Verbalized acceptance and understanding.  Overdue for Flu vaccine. Education has been provided regarding the importance of this vaccine and advised to receive when available. Verbalized acceptance and understanding.  Due for Tdap vaccine. Education has been provided regarding the importance of this vaccine. Advised may receive this vaccine at local pharmacy or Health Dept. Aware to provide a copy of the vaccination record if obtained from local pharmacy or Health Dept. Verbalized acceptance and understanding.  Cancer Screenings: Lung: Low Dose CT Chest recommended if Age 43-80 years, 30 pack-year currently smoking OR have quit w/in 15years. Patient does not qualify. Colorectal: Completed 02/16/18. Repeat every 10 years  Additional Screenings: Hepatitis C Screening: Completed 01/19/18    Plan:  I have personally reviewed and addressed the Medicare Annual Wellness questionnaire and have noted the following in the patient's chart:  A. Medical and social history B. Use of alcohol, tobacco or illicit drugs  C. Current medications and supplements D. Functional ability and status E.  Nutritional status F.  Physical activity G. Advance directives H. List of other physicians I.  Hospitalizations, surgeries, and ER visits in previous 12 months J.  [place] such as hearing and vision if needed, cognitive and depression L. Referrals and appointments  In addition, I have reviewed and discussed with patient certain preventive protocols,  quality metrics, and best practice recommendations. A written personalized care plan for preventive services as well as general preventive health recommendations were provided to patient.  Signed,  Aleatha Borer, LPN Nurse Health Advisor  MD Recommendations: Due for Shingrix. Education has been provided regarding the importance of this vaccine. Pt has been advised to call insurance company to determine out of pocket expense. Advised may also receive vaccine at  local pharmacy or Health Dept. Verbalized acceptance and understanding.  Overdue for Flu vaccine. Education has been provided regarding the importance of this vaccine and advised to receive when available. Verbalized acceptance and understanding.  Due for Tdap vaccine. Education has been provided regarding the importance of this vaccine. Advised may receive this vaccine at local pharmacy or Health Dept. Aware to provide a copy of the vaccination record if obtained from local pharmacy or Health Dept. Verbalized acceptance and understanding.

## 2018-07-15 NOTE — Patient Instructions (Signed)
Nicholas [name] , Thank you for taking time to come for your Medicare Wellness Visit. I appreciate your ongoing commitment to your health goals. Please review the following plan we discussed and let me know if I can assist you in the future.   Screening recommendations/referrals: Colorectal Screening: Up to date  Vision and Dental Exams: Recommended annual ophthalmology exams for early detection of glaucoma and other disorders of the eye Recommended annual dental exams for proper oral hygiene  Vaccinations: Influenza vaccine: Overdue Pneumococcal vaccine: Not yet required Tdap vaccine: Declined. Please call your insurance company to determine your out of pocket expense. You may also receive this vaccine at your local pharmacy or Health Dept. Shingles vaccine: Please call your insurance company to determine your out of pocket expense for the Shingrix vaccine. You may also receive this vaccine at your local pharmacy or Health Dept.    Advanced directives: Advance directive discussed with you today. I have provided a copy for you to complete at home and have notarized. Once this is complete please bring a copy in to our office so we can scan it into your chart.  Goals: Recommend to drink at least 6-8 8oz glasses of water per day.  Next appointment: Please schedule your Annual Wellness Visit with your Nurse Health Advisor in one year.  Preventive Care 40-64 Years, Male Preventive care refers to lifestyle choices and visits with your health care provider that can promote health and wellness. What does preventive care include?  A yearly physical exam. This is also called an annual well check.  Dental exams once or twice a year.  Routine eye exams. Ask your health care provider how often you should have your eyes checked.  Personal lifestyle choices, including:  Daily care of your teeth and gums.  Regular physical activity.  Eating a healthy diet.  Avoiding tobacco and drug  use.  Limiting alcohol use.  Practicing safe sex.  Taking low-dose aspirin every day starting at age 73. What happens during an annual well check? The services and screenings done by your health care provider during your annual well check will depend on your age, overall health, lifestyle risk factors, and family history of disease. Counseling  Your health care provider may ask you questions about your:  Alcohol use.  Tobacco use.  Drug use.  Emotional well-being.  Home and relationship well-being.  Sexual activity.  Eating habits.  Work and work Statistician. Screening  You may have the following tests or measurements:  Height, weight, and BMI.  Blood pressure.  Lipid and cholesterol levels. These may be checked every 5 years, or more frequently if you are over 73 years old.  Skin check.  Lung cancer screening. You may have this screening every year starting at age 1 if you have a 30-pack-year history of smoking and currently smoke or have quit within the past 15 years.  Fecal occult blood test (FOBT) of the stool. You may have this test every year starting at age 1.  Flexible sigmoidoscopy or colonoscopy. You may have a sigmoidoscopy every 5 years or a colonoscopy every 10 years starting at age 54.  Prostate cancer screening. Recommendations will vary depending on your family history and other risks.  Hepatitis C blood test.  Hepatitis B blood test.  Sexually transmitted disease (STD) testing.  Diabetes screening. This is done by checking your blood sugar (glucose) after you have not eaten for a while (fasting). You may have this done every 1-3 years. Discuss your test  results, treatment options, and if necessary, the need for more tests with your health care provider. Vaccines  Your health care provider may recommend certain vaccines, such as:  Influenza vaccine. This is recommended every year.  Tetanus, diphtheria, and acellular pertussis (Tdap, Td)  vaccine. You may need a Td booster every 10 years.  Zoster vaccine. You may need this after age 42.  Pneumococcal 13-valent conjugate (PCV13) vaccine. You may need this if you have certain conditions and have not been vaccinated.  Pneumococcal polysaccharide (PPSV23) vaccine. You may need one or two doses if you smoke cigarettes or if you have certain conditions. Talk to your health care provider about which screenings and vaccines you need and how often you need them. This information is not intended to replace advice given to you by your health care provider. Make sure you discuss any questions you have with your health care provider. Document Released: 12/15/2015 Document Revised: 08/07/2016 Document Reviewed: 09/19/2015 Elsevier Interactive Patient Education  2017 [employer] Prevention in the Home Falls can cause injuries. They can happen to people of all ages. There are many things you can do to make your home safe and to help prevent falls. What can I do on the outside of my home?  Regularly fix the edges of walkways and driveways and fix any cracks.  Remove anything that might make you trip as you walk through a door, such as a raised step or threshold.  Trim any bushes or trees on the path to your home.  Use bright outdoor lighting.  Clear any walking paths of anything that might make someone trip, such as rocks or tools.  Regularly check to see if handrails are loose or broken. Make sure that both sides of any steps have handrails.  Any raised decks and porches should have guardrails on the edges.  Have any leaves, snow, or ice cleared regularly.  Use sand or salt on walking paths during winter.  Clean up any spills in your garage right away. This includes oil or grease spills. What can I do in the bathroom?  Use night lights.  Install grab bars by the toilet and in the tub and shower. Do not use towel bars as grab bars.  Use non-skid mats or decals in the  tub or shower.  If you need to sit down in the shower, use a plastic, non-slip stool.  Keep the floor dry. Clean up any water that spills on the floor as soon as it happens.  Remove soap buildup in the tub or shower regularly.  Attach bath mats securely with double-sided non-slip rug tape.  Do not have throw rugs and other things on the floor that can make you trip. What can I do in the bedroom?  Use night lights.  Make sure that you have a light by your bed that is easy to reach.  Do not use any sheets or blankets that are too big for your bed. They should not hang down onto the floor.  Have a firm chair that has side arms. You can use this for support while you get dressed.  Do not have throw rugs and other things on the floor that can make you trip. What can I do in the kitchen?  Clean up any spills right away.  Avoid walking on wet floors.  Keep items that you use a lot in easy-to-reach places.  If you need to reach something above you, use a strong step stool that  has a grab bar.  Keep electrical cords out of the way.  Do not use floor polish or wax that makes floors slippery. If you must use wax, use non-skid floor wax.  Do not have throw rugs and other things on the floor that can make you trip. What can I do with my stairs?  Do not leave any items on the stairs.  Make sure that there are handrails on both sides of the stairs and use them. Fix handrails that are broken or loose. Make sure that handrails are as long as the stairways.  Check any carpeting to make sure that it is firmly attached to the stairs. Fix any carpet that is loose or worn.  Avoid having throw rugs at the top or bottom of the stairs. If you do have throw rugs, attach them to the floor with carpet tape.  Make sure that you have a light switch at the top of the stairs and the bottom of the stairs. If you do not have them, ask someone to add them for you. What else can I do to help prevent  falls?  Wear shoes that:  Do not have high heels.  Have rubber bottoms.  Are comfortable and fit you well.  Are closed at the toe. Do not wear sandals.  If you use a stepladder:  Make sure that it is fully opened. Do not climb a closed stepladder.  Make sure that both sides of the stepladder are locked into place.  Ask someone to hold it for you, if possible.  Clearly mark and make sure that you can see:  Any grab bars or handrails.  First and last steps.  Where the edge of each step is.  Use tools that help you move around (mobility aids) if they are needed. These include:  Canes.  Walkers.  Scooters.  Crutches.  Turn on the lights when you go into a dark area. Replace any light bulbs as soon as they burn out.  Set up your furniture so you have a clear path. Avoid moving your furniture around.  If any of your floors are uneven, fix them.  If there are any pets around you, be aware of where they are.  Review your medicines with your doctor. Some medicines can make you feel dizzy. This can increase your chance of falling. Ask your doctor what other things that you can do to help prevent falls. This information is not intended to replace advice given to you by your health care provider. Make sure you discuss any questions you have with your health care provider. Document Released: 09/14/2009 Document Revised: 04/25/2016 Document Reviewed: 12/23/2014 Elsevier Interactive Patient Education  2017 [employer].

## 2018-07-20 ENCOUNTER — Ambulatory Visit (INDEPENDENT_AMBULATORY_CARE_PROVIDER_SITE_OTHER): Payer: Medicare Other | Admitting: Internal Medicine

## 2018-07-20 ENCOUNTER — Encounter: Payer: Self-pay | Admitting: Internal Medicine

## 2018-07-20 VITALS — BP 128/82 | HR 68 | Ht 71.0 in | Wt 178.0 lb

## 2018-07-20 DIAGNOSIS — N529 Male erectile dysfunction, unspecified: Secondary | ICD-10-CM | POA: Diagnosis not present

## 2018-07-20 DIAGNOSIS — I10 Essential (primary) hypertension: Secondary | ICD-10-CM

## 2018-07-20 DIAGNOSIS — F332 Major depressive disorder, recurrent severe without psychotic features: Secondary | ICD-10-CM

## 2018-07-20 DIAGNOSIS — G8929 Other chronic pain: Secondary | ICD-10-CM | POA: Diagnosis not present

## 2018-07-20 DIAGNOSIS — M25511 Pain in right shoulder: Secondary | ICD-10-CM | POA: Diagnosis not present

## 2018-07-20 MED ORDER — TADALAFIL 20 MG PO TABS: 20.0000 mg | ORAL_TABLET | Freq: Every day | ORAL | 0 refills | Status: DC | PRN

## 2018-07-20 MED ORDER — AMLODIPINE BESYLATE 10 MG PO TABS: 10.0000 mg | ORAL_TABLET | Freq: Every day | ORAL | 12 refills | Status: DC

## 2018-07-20 NOTE — Progress Notes (Signed)
Date:  07/20/2018   Name:  Nicholas [name]   DOB:  07-Sep-1966   MRN:  [id number]   Chief Complaint: Hypertension Hypertension  This is a chronic problem. The problem is controlled. Pertinent negatives include no chest pain, headaches, palpitations, peripheral edema or shortness of breath. Past treatments include calcium channel blockers. The current treatment provides significant improvement. There are no compliance problems.   Depression         This is a chronic problem.The problem is unchanged.  Associated symptoms include no headaches.  Past treatments include SNRIs - Serotonin and norepinephrine reuptake inhibitors. Shoulder Pain   This is a chronic (on disability due to ongoing pain - followed by pain management) problem. The problem has been unchanged. He has tried NSAIDS (gabapentin) for the symptoms.  ED - sildenafil 100 mg was not sufficient to complete intercourse.  He is wondering if there are other options.     Review of Systems  Respiratory: Negative for shortness of breath.   Cardiovascular: Negative for chest pain and palpitations.  Gastrointestinal: Negative for abdominal pain.  Musculoskeletal: Positive for arthralgias and joint swelling.  Neurological: Negative for dizziness and headaches.  Hematological: Negative for adenopathy.  Psychiatric/Behavioral: Positive for depression. Negative for dysphoric mood and sleep disturbance. The patient is not nervous/anxious.     Patient Active Problem List   Diagnosis Date Noted  . Special screening for malignant neoplasms, colon   . Testicular pain, right 01/19/2018  . Hyperglycemia 07/14/2017  . Leukocytosis 07/14/2017  . Chronic right shoulder pain 03/27/2017  . Severe episode of recurrent major depressive disorder, without psychotic features ([place]) 07/18/2016  . Generalized anxiety disorder 07/18/2016  . ED (erectile dysfunction) 06/17/2015  . Essential (primary) hypertension 06/17/2015  . Enlarged prostate  06/17/2015    No Known Allergies  Past Surgical History:  Procedure Laterality Date  . COLONOSCOPY WITH PROPOFOL N/A 02/16/2018   Procedure: COLONOSCOPY WITH PROPOFOL;  Surgeon: Lucilla Lame, MD;  Location: [facility];  Service: Endoscopy;  Laterality: N/A;  . SHOULDER OPEN ROTATOR CUFF REPAIR Right 03/2016  . shoulder surgery Right     Social History   Tobacco Use  . Smoking status: Never Smoker  . Smokeless tobacco: Never Used  . Tobacco comment: smoking cessation materials not required  Substance Use Topics  . Alcohol use: No  . Drug use: No     Medication list has been reviewed and updated.  Current Meds  Medication Sig  . amLODipine (NORVASC) 10 MG tablet Take 1 tablet (10 mg total) by mouth daily.  . cyclobenzaprine (FLEXERIL) 10 MG tablet Take 10 mg by mouth 3 (three) times daily as needed. Reported on 05/09/2016  . etodolac (LODINE) 400 MG tablet Take 400 mg by mouth 2 (two) times daily.  . pregabalin (LYRICA) 150 MG capsule Take 150 mg by mouth 2 (two) times daily.  . sildenafil (REVATIO) 20 MG tablet TAKE 1-2 TABLETS BY MOUTH EVERY DAY AS NEEDED  . traMADol (ULTRAM) 50 MG tablet TAKE 1 TABLET BY MOUTH EVERY 6 HOURS AS NEEDED FOR PAIN, NEEDS APPT FOR FUTURE REFILLS  . traZODone (DESYREL) 100 MG tablet Take 2 tablets (200 mg total) by mouth at bedtime.  Marland Kitchen venlafaxine XR (EFFEXOR-XR) 75 MG 24 hr capsule Take 1 capsule (75 mg total) by mouth daily.    PHQ 2/9 Scores 07/15/2018 01/19/2018 07/14/2017  PHQ - 2 Score 2 0 2  PHQ- 9 Score 2 0 2    Physical Exam  BP 128/82 (BP Location: Right Arm, Patient Position: Sitting, Cuff Size: Normal)   Pulse 68   Ht 5\' 11"  (1.803 m)   Wt 178 lb (80.7 kg)   SpO2 100%   BMI 24.83 kg/m   Assessment and Plan: 1. Essential (primary) hypertension controlled - amLODipine (NORVASC) 10 MG tablet; Take 1 tablet (10 mg total) by mouth daily.  Dispense: 30 tablet; Refill: 12  2. Severe episode of recurrent major depressive  disorder, without psychotic features ([place]) Doing well on current medications prescribed by pain management  3. Chronic right shoulder pain Stable He wants to find PTx closer to [facility] - it is covered as part of his settlement so will need referral from Orthopedics  4. Erectile dysfunction, unspecified erectile dysfunction type Trial Cialis 20 mg   Meds ordered this encounter  Medications  . tadalafil (CIALIS) 20 MG tablet    Sig: Take 1 tablet (20 mg total) by mouth daily as needed for erectile dysfunction.    Dispense:  10 tablet    Refill:  0  . amLODipine (NORVASC) 10 MG tablet    Sig: Take 1 tablet (10 mg total) by mouth daily.    Dispense:  30 tablet    Refill:  12    Partially dictated using Editor, commissioning. Any errors are unintentional.  Halina Maidens, MD [facility]  07/20/2018

## 2018-07-20 NOTE — Patient Instructions (Signed)
Physical therapy in [facility], [facility]

## 2018-09-30 ENCOUNTER — Other Ambulatory Visit: Payer: Self-pay | Admitting: Internal Medicine

## 2018-10-08 ENCOUNTER — Other Ambulatory Visit: Payer: Self-pay | Admitting: Internal Medicine

## 2019-01-05 ENCOUNTER — Other Ambulatory Visit: Payer: Self-pay | Admitting: Internal Medicine

## 2019-01-20 ENCOUNTER — Other Ambulatory Visit: Payer: Self-pay

## 2019-01-20 ENCOUNTER — Encounter: Payer: Self-pay | Admitting: Internal Medicine

## 2019-01-20 ENCOUNTER — Ambulatory Visit (INDEPENDENT_AMBULATORY_CARE_PROVIDER_SITE_OTHER): Payer: Medicare Other | Admitting: Internal Medicine

## 2019-01-20 VITALS — BP 130/78 | HR 78 | Ht 71.0 in | Wt 174.0 lb

## 2019-01-20 DIAGNOSIS — N529 Male erectile dysfunction, unspecified: Secondary | ICD-10-CM | POA: Diagnosis not present

## 2019-01-20 DIAGNOSIS — D72829 Elevated white blood cell count, unspecified: Secondary | ICD-10-CM

## 2019-01-20 DIAGNOSIS — N400 Enlarged prostate without lower urinary tract symptoms: Secondary | ICD-10-CM

## 2019-01-20 DIAGNOSIS — G8929 Other chronic pain: Secondary | ICD-10-CM

## 2019-01-20 DIAGNOSIS — E785 Hyperlipidemia, unspecified: Secondary | ICD-10-CM | POA: Diagnosis not present

## 2019-01-20 DIAGNOSIS — F332 Major depressive disorder, recurrent severe without psychotic features: Secondary | ICD-10-CM

## 2019-01-20 DIAGNOSIS — M25511 Pain in right shoulder: Secondary | ICD-10-CM | POA: Diagnosis not present

## 2019-01-20 DIAGNOSIS — I10 Essential (primary) hypertension: Secondary | ICD-10-CM

## 2019-01-20 LAB — POCT URINALYSIS DIPSTICK
Bilirubin, UA: NEGATIVE
Blood, UA: NEGATIVE
GLUCOSE UA: NEGATIVE
KETONES UA: NEGATIVE
LEUKOCYTES UA: NEGATIVE
Nitrite, UA: NEGATIVE
Protein, UA: NEGATIVE
Spec Grav, UA: 1.01 (ref 1.010–1.025)
Urobilinogen, UA: 0.2 E.U./dL
pH, UA: 6 (ref 5.0–8.0)

## 2019-01-20 MED ORDER — SILDENAFIL CITRATE 20 MG PO TABS: 20.0000 mg | ORAL_TABLET | Freq: Every day | ORAL | 5 refills | Status: DC | PRN

## 2019-01-20 NOTE — Progress Notes (Signed)
Date:  01/20/2019   Name:  Nicholas [name]   DOB:  1966-11-19   MRN:  [id number]   Chief Complaint: Annual Exam Nicholas [name] is a 53 y.o. male who presents today for his annual check up. He feels fairly well. He reports exercising some. He reports he is sleeping fairly well.  CRC with colonoscopy was done last year and was normal. He is still struggling with his shoulder pain.  Now on disability.  Hypertension  This is a chronic problem. The problem is controlled. Pertinent negatives include no chest pain, headaches, palpitations or shortness of breath. Past treatments include calcium channel blockers. The current treatment provides significant improvement.  Depression         This is a chronic problem.The problem is unchanged.  Associated symptoms include no fatigue, no appetite change, no myalgias and no headaches.  Past treatments include SNRIs - Serotonin and norepinephrine reuptake inhibitors and other medications (and starting Bupropion from [facility] and Neurology). Benign Prostatic Hypertrophy  Irritative symptoms do not include frequency or urgency. Pertinent negatives include no chills or dysuria.  He was seen by Urology for ED and elevated PSA.  Last reading last year was borderline high.  Review of Systems  Constitutional: Negative for appetite change, chills, diaphoresis, fatigue and unexpected weight change.  HENT: Negative for hearing loss, tinnitus, trouble swallowing and voice change.   Eyes: Negative for visual disturbance.  Respiratory: Negative for choking, shortness of breath and wheezing.   Cardiovascular: Negative for chest pain, palpitations and leg swelling.  Gastrointestinal: Negative for abdominal pain, blood in stool, constipation and diarrhea.  Genitourinary: Negative for difficulty urinating, dysuria, frequency and urgency.       Mild ED  Musculoskeletal: Positive for arthralgias (shoulder and back pain persistent). Negative for back pain and  myalgias.  Skin: Negative for color change and rash.  Allergic/Immunologic: Negative for environmental allergies.  Neurological: Negative for dizziness, syncope and headaches.  Hematological: Negative for adenopathy.  Psychiatric/Behavioral: Positive for depression. Negative for dysphoric mood and sleep disturbance.    Patient Active Problem List   Diagnosis Date Noted  . Special screening for malignant neoplasms, colon   . Testicular pain, right 01/19/2018  . Hyperglycemia 07/14/2017  . Leukocytosis 07/14/2017  . Chronic right shoulder pain 03/27/2017  . Severe episode of recurrent major depressive disorder, without psychotic features ([place]) 07/18/2016  . Generalized anxiety disorder 07/18/2016  . ED (erectile dysfunction) 06/17/2015  . Essential (primary) hypertension 06/17/2015  . Enlarged prostate 06/17/2015    No Known Allergies  Past Surgical History:  Procedure Laterality Date  . COLONOSCOPY WITH PROPOFOL N/A 02/16/2018   Procedure: COLONOSCOPY WITH PROPOFOL;  Surgeon: Lucilla Lame, MD;  Location: [facility];  Service: Endoscopy;  Laterality: N/A;  . SHOULDER OPEN ROTATOR CUFF REPAIR Right 03/2016  . shoulder surgery Right     Social History   Tobacco Use  . Smoking status: Never Smoker  . Smokeless tobacco: Never Used  . Tobacco comment: smoking cessation materials not required  Substance Use Topics  . Alcohol use: No  . Drug use: No     Medication list has been reviewed and updated.  Current Meds  Medication Sig  . amLODipine (NORVASC) 10 MG tablet Take 1 tablet (10 mg total) by mouth daily.  . cyclobenzaprine (FLEXERIL) 10 MG tablet Take 10 mg by mouth 3 (three) times daily as needed. Reported on 05/09/2016  . pregabalin (LYRICA) 150 MG capsule Take 150 mg by mouth  daily.  . sildenafil (REVATIO) 20 MG tablet TAKE 1-2 TABLETS BY MOUTH EVERY DAY AS NEEDED  . traMADol (ULTRAM) 50 MG tablet TAKE 1 TABLET BY MOUTH EVERY 6 HOURS AS NEEDED FOR PAIN, NEEDS  APPT FOR FUTURE REFILLS  . traZODone (DESYREL) 100 MG tablet TAKE 1 TABLET BY MOUTH EVERYDAY AT BEDTIME  . venlafaxine XR (EFFEXOR-XR) 150 MG 24 hr capsule TAKE 1 CAPSULE BY MOUTH EVERY DAY    PHQ 2/9 Scores 01/20/2019 07/15/2018 01/19/2018 07/14/2017  PHQ - 2 Score 0 2 0 2  PHQ- 9 Score - 2 0 2    Physical Exam Vitals signs and nursing note reviewed.  Constitutional:      Appearance: Normal appearance. He is well-developed.  HENT:     Head: Normocephalic.     Right Ear: Tympanic membrane, ear canal and external ear normal.     Left Ear: Tympanic membrane, ear canal and external ear normal.     Nose: Nose normal.     Mouth/Throat:     Pharynx: Uvula midline.  Eyes:     Conjunctiva/sclera: Conjunctivae normal.     Pupils: Pupils are equal, round, and reactive to light.  Neck:     Musculoskeletal: Normal range of motion and neck supple.     Thyroid: No thyromegaly.     Vascular: No carotid bruit.  Cardiovascular:     Rate and Rhythm: Normal rate and regular rhythm.     Heart sounds: Normal heart sounds.  Pulmonary:     Effort: Pulmonary effort is normal.     Breath sounds: Normal breath sounds. No wheezing.  Chest:     Breasts:        Right: No mass.        Left: No mass.  Abdominal:     General: Bowel sounds are normal.     Palpations: Abdomen is soft.     Tenderness: There is no abdominal tenderness.  Musculoskeletal: Normal range of motion.  Lymphadenopathy:     Cervical: No cervical adenopathy.  Skin:    General: Skin is warm and dry.  Neurological:     Mental Status: He is alert and oriented to person, place, and time.     Deep Tendon Reflexes: Reflexes are normal and symmetric.  Psychiatric:        Speech: Speech normal.        Behavior: Behavior normal.        Thought Content: Thought content normal.        Judgment: Judgment normal.     BP (!) 142/78   Pulse 78   Ht 5\' 11"  (1.803 m)   Wt 174 lb (78.9 kg)   SpO2 97%   BMI 24.27 kg/m   Assessment and  Plan: 1. Essential (primary) hypertension controlled - CBC with Differential/Platelet - Comprehensive metabolic panel - POCT urinalysis dipstick  2. Enlarged prostate DRE deferred - if PSA is higher will refer back to Urology - PSA  3. Severe episode of recurrent major depressive disorder, without psychotic features ([place]) Doing better on medication and Psych care  4. Erectile dysfunction, unspecified erectile dysfunction type - sildenafil (REVATIO) 20 MG tablet; Take 1 tablet (20 mg total) by mouth daily as needed.  Dispense: 30 tablet; Refill: 5  5. Leukocytosis, unspecified type Noted on previous labs; felt to be due to recurrent steroid joint injections - CBC with Differential/Platelet  6. Chronic right shoulder pain Taking Lyrica and Tramadol from Ortho/pain management  7. Mild hyperlipidemia Check labs  and advise - Lipid panel   Partially dictated using Editor, commissioning. Any errors are unintentional.  Halina Maidens, MD [facility]  01/20/2019

## 2019-01-21 LAB — CBC WITH DIFFERENTIAL/PLATELET
BASOS ABS: 0.1 10*3/uL (ref 0.0–0.2)
Basos: 1 %
EOS (ABSOLUTE): 0.1 10*3/uL (ref 0.0–0.4)
Eos: 2 %
HEMOGLOBIN: 14.9 g/dL (ref 13.0–17.7)
Hematocrit: 43.2 % (ref 37.5–51.0)
IMMATURE GRANS (ABS): 0 10*3/uL (ref 0.0–0.1)
Immature Granulocytes: 0 %
LYMPHS: 24 %
Lymphocytes Absolute: 1.7 10*3/uL (ref 0.7–3.1)
MCH: 29.9 pg (ref 26.6–33.0)
MCHC: 34.5 g/dL (ref 31.5–35.7)
MCV: 87 fL (ref 79–97)
Monocytes Absolute: 0.5 10*3/uL (ref 0.1–0.9)
Monocytes: 6 %
NEUTROS ABS: 4.7 10*3/uL (ref 1.4–7.0)
NEUTROS PCT: 67 %
PLATELETS: 268 10*3/uL (ref 150–450)
RBC: 4.98 x10E6/uL (ref 4.14–5.80)
RDW: 11.7 % (ref 11.6–15.4)
WBC: 7 10*3/uL (ref 3.4–10.8)

## 2019-01-21 LAB — COMPREHENSIVE METABOLIC PANEL
ALBUMIN: 4.6 g/dL (ref 3.8–4.9)
ALT: 13 IU/L (ref 0–44)
AST: 18 IU/L (ref 0–40)
Albumin/Globulin Ratio: 1.8 (ref 1.2–2.2)
Alkaline Phosphatase: 90 IU/L (ref 39–117)
BUN / CREAT RATIO: 7 — AB (ref 9–20)
BUN: 8 mg/dL (ref 6–24)
Bilirubin Total: 1.3 mg/dL — ABNORMAL HIGH (ref 0.0–1.2)
CALCIUM: 9.7 mg/dL (ref 8.7–10.2)
CO2: 24 mmol/L (ref 20–29)
CREATININE: 1.15 mg/dL (ref 0.76–1.27)
Chloride: 103 mmol/L (ref 96–106)
GFR calc non Af Amer: 72 mL/min/{1.73_m2} (ref >59)
GFR, EST AFRICAN AMERICAN: 83 mL/min/{1.73_m2} (ref >59)
GLUCOSE: 108 mg/dL — AB (ref 65–99)
Globulin, Total: 2.5 g/dL (ref 1.5–4.5)
Potassium: 4.4 mmol/L (ref 3.5–5.2)
Sodium: 142 mmol/L (ref 134–144)
Total Protein: 7.1 g/dL (ref 6.0–8.5)

## 2019-01-21 LAB — LIPID PANEL
Chol/HDL Ratio: 4.6 ratio (ref 0.0–5.0)
Cholesterol, Total: 212 mg/dL — ABNORMAL HIGH (ref 100–199)
HDL: 46 mg/dL (ref >39)
LDL CALC: 151 mg/dL — AB (ref 0–99)
Triglycerides: 76 mg/dL (ref 0–149)
VLDL CHOLESTEROL CAL: 15 mg/dL (ref 5–40)

## 2019-01-21 LAB — PSA: PROSTATE SPECIFIC AG, SERUM: 3.5 ng/mL (ref 0.0–4.0)

## 2019-01-22 ENCOUNTER — Other Ambulatory Visit: Payer: Self-pay | Admitting: Internal Medicine

## 2019-01-22 ENCOUNTER — Encounter: Payer: Self-pay | Admitting: Internal Medicine

## 2019-01-22 DIAGNOSIS — E785 Hyperlipidemia, unspecified: Secondary | ICD-10-CM | POA: Insufficient documentation

## 2019-03-08 ENCOUNTER — Other Ambulatory Visit: Payer: Medicare Other

## 2019-03-08 ENCOUNTER — Telehealth: Payer: Self-pay | Admitting: Urology

## 2019-03-08 ENCOUNTER — Other Ambulatory Visit: Payer: Self-pay

## 2019-03-08 DIAGNOSIS — R3129 Other microscopic hematuria: Secondary | ICD-10-CM

## 2019-03-08 DIAGNOSIS — R300 Dysuria: Secondary | ICD-10-CM | POA: Diagnosis not present

## 2019-03-08 LAB — URINALYSIS, COMPLETE
Bilirubin, UA: NEGATIVE
Glucose, UA: NEGATIVE
Ketones, UA: NEGATIVE
Nitrite, UA: NEGATIVE
Protein,UA: NEGATIVE
Specific Gravity, UA: 1.01 (ref 1.005–1.030)
Urobilinogen, Ur: 0.2 mg/dL (ref 0.2–1.0)
pH, UA: 6.5 (ref 5.0–7.5)

## 2019-03-08 LAB — MICROSCOPIC EXAMINATION: Bacteria, UA: NONE SEEN

## 2019-03-08 NOTE — Telephone Encounter (Signed)
Patient called the office today and stated that he saw blood in is urine on Saturday and a small amount on Sunday. He is thinking he has a UTI but he doesn't have any symptoms? No burning, I told him I would send a message but not sure about the UTI?   Sharyn Lull

## 2019-03-08 NOTE — Telephone Encounter (Signed)
Left message for patient to return call.

## 2019-03-08 NOTE — Telephone Encounter (Signed)
Orders for UA and culture in. Patient needs appt for lab only

## 2019-03-08 NOTE — Telephone Encounter (Signed)
If he is not having symptoms of an UTI, he will need to be evaluated for gross hematuria.  We can start with an UA and urine culture.

## 2019-03-08 NOTE — Telephone Encounter (Signed)
Patient not seen in a year, please advise on how you would like me to proceed. Thanks

## 2019-03-09 LAB — CHLAMYDIA/GONOCOCCUS/TRICHOMONAS, NAA
Chlamydia by NAA: NEGATIVE
Gonococcus by NAA: NEGATIVE
Trich vag by NAA: NEGATIVE

## 2019-03-10 ENCOUNTER — Telehealth: Payer: Self-pay

## 2019-03-10 NOTE — Telephone Encounter (Signed)
Patient notified

## 2019-03-10 NOTE — Telephone Encounter (Signed)
-----   Message from Nori Riis, PA-C sent at 03/09/2019  4:54 PM EDT ----- Please let Mr. Markin know that his Chlamydia and gonorrhea came back negative.

## 2019-03-11 ENCOUNTER — Telehealth: Payer: Self-pay

## 2019-03-11 LAB — CULTURE, URINE COMPREHENSIVE

## 2019-03-11 MED ORDER — AMOXICILLIN-POT CLAVULANATE 875-125 MG PO TABS: 1.0000 | ORAL_TABLET | Freq: Two times a day (BID) | ORAL | 0 refills | Status: DC

## 2019-03-11 NOTE — Telephone Encounter (Signed)
Patient notified , script sent and apt made

## 2019-03-11 NOTE — Telephone Encounter (Signed)
-----   Message from Nori Riis, PA-C sent at 03/11/2019  2:34 PM EDT ----- This is likely a skin contaminate.  But he did complain of gross hematuria, so we will go ahead and treat.  We can go ahead and get him started on Augmentin 875/125, BID x 5 days.  He will still need an office visit in three months to be evaluated for the hematuria.

## 2019-03-31 ENCOUNTER — Ambulatory Visit (INDEPENDENT_AMBULATORY_CARE_PROVIDER_SITE_OTHER): Payer: Medicare Other | Admitting: Internal Medicine

## 2019-03-31 ENCOUNTER — Encounter: Payer: Self-pay | Admitting: Internal Medicine

## 2019-03-31 ENCOUNTER — Other Ambulatory Visit: Payer: Self-pay

## 2019-03-31 ENCOUNTER — Other Ambulatory Visit: Payer: Self-pay | Admitting: Internal Medicine

## 2019-03-31 VITALS — BP 140/80 | HR 72 | Ht 71.0 in | Wt 176.0 lb

## 2019-03-31 DIAGNOSIS — B356 Tinea cruris: Secondary | ICD-10-CM | POA: Diagnosis not present

## 2019-03-31 MED ORDER — FLUCONAZOLE 100 MG PO TABS: ORAL_TABLET | ORAL | 0 refills | Status: DC

## 2019-03-31 MED ORDER — CLOTRIMAZOLE-BETAMETHASONE 1-0.05 % EX CREA: 1.0000 "application " | TOPICAL_CREAM | Freq: Two times a day (BID) | CUTANEOUS | 0 refills | Status: DC

## 2019-03-31 NOTE — Progress Notes (Signed)
Date:  03/31/2019   Name:  Nicholas [name]   DOB:  [birth year]   MRN:  [id number]   Chief Complaint: Rash (Rash on inner thighs. Itching. Started Sunday. Had jock itch before.)  Rash  This is a new problem. The current episode started in the past 7 days. The problem has been gradually worsening since onset. The affected locations include the groin, left upper leg and right upper leg. The rash is characterized by itchiness and redness. He was exposed to nothing. Pertinent negatives include no fever or shortness of breath. Past treatments include nothing.  The rash started after he was treated with Amoxicillin for hematuria and after he worked in the yard and perspired heavily.  He has has jock itch before and has been using some otc cream without benefit.  He is concerned this might be due to an STD. He was tested last week and GC/chlamydia and was negative.  Review of Systems  Constitutional: Negative for chills and fever.  Respiratory: Negative for chest tightness and shortness of breath.   Cardiovascular: Negative for chest pain and leg swelling.  Skin: Positive for rash (and itching).  Neurological: Negative for dizziness and headaches.    Patient Active Problem List   Diagnosis Date Noted  . Mild hyperlipidemia 01/22/2019  . Hyperglycemia 07/14/2017  . Leukocytosis 07/14/2017  . Chronic right shoulder pain 03/27/2017  . Severe episode of recurrent major depressive disorder, without psychotic features ([place]) 07/18/2016  . Generalized anxiety disorder 07/18/2016  . ED (erectile dysfunction) 06/17/2015  . Essential (primary) hypertension 06/17/2015  . Enlarged prostate 06/17/2015    No Known Allergies  Past Surgical History:  Procedure Laterality Date  . COLONOSCOPY WITH PROPOFOL N/A 02/16/2018   Procedure: COLONOSCOPY WITH PROPOFOL;  Surgeon: Lucilla Lame, MD;  Location: [facility];  Service: Endoscopy;  Laterality: N/A;  . SHOULDER OPEN ROTATOR CUFF REPAIR Right  03/2016  . shoulder surgery Right     Social History   Tobacco Use  . Smoking status: Never Smoker  . Smokeless tobacco: Never Used  . Tobacco comment: smoking cessation materials not required  Substance Use Topics  . Alcohol use: No  . Drug use: No     Medication list has been reviewed and updated.  Current Meds  Medication Sig  . amLODipine (NORVASC) 10 MG tablet Take 1 tablet (10 mg total) by mouth daily.  Marland Kitchen buPROPion (WELLBUTRIN XL) 150 MG 24 hr tablet Take 2 tablets by mouth daily. [facility].  . cyclobenzaprine (FLEXERIL) 10 MG tablet Take 10 mg by mouth 3 (three) times daily as needed. Reported on 05/09/2016  . pregabalin (LYRICA) 150 MG capsule Take 150 mg by mouth daily.  . sildenafil (REVATIO) 20 MG tablet Take 1 tablet (20 mg total) by mouth daily as needed.  . traMADol (ULTRAM) 50 MG tablet TAKE 1 TABLET BY MOUTH EVERY 6 HOURS AS NEEDED FOR PAIN, NEEDS APPT FOR FUTURE REFILLS  . traZODone (DESYREL) 100 MG tablet TAKE 1 TABLET BY MOUTH EVERYDAY AT BEDTIME  . venlafaxine XR (EFFEXOR-XR) 150 MG 24 hr capsule TAKE 1 CAPSULE BY MOUTH EVERY DAY    PHQ 2/9 Scores 03/31/2019 01/20/2019 07/15/2018 01/19/2018  PHQ - 2 Score 0 0 2 0  PHQ- 9 Score - - 2 0    BP Readings from Last 3 Encounters:  03/31/19 140/80  01/20/19 130/78  07/20/18 128/82    Physical Exam Vitals signs and nursing note reviewed.  Constitutional:  General: He is not in acute distress.    Appearance: He is well-developed.  HENT:     Head: Normocephalic and atraumatic.  Pulmonary:     Effort: Pulmonary effort is normal. No respiratory distress.  Genitourinary:    Penis: Normal and circumcised.      Scrotum/Testes: Normal.     Comments: Mild erythema on bilateral outer scrotum Musculoskeletal: Normal range of motion.  Skin:    General: Skin is warm and dry.     Findings: Rash (erythema in both groin folds) present.  Neurological:     Mental Status: He is alert and oriented to  person, place, and time.  Psychiatric:        Behavior: Behavior normal.        Thought Content: Thought content normal.     Wt Readings from Last 3 Encounters:  03/31/19 176 lb (79.8 kg)  01/20/19 174 lb (78.9 kg)  07/20/18 178 lb (80.7 kg)    BP 140/80   Pulse 72   Ht 5\' 11"  (1.803 m)   Wt 176 lb (79.8 kg)   SpO2 98%   BMI 24.55 kg/m   Assessment and Plan: 1. Tinea cruris Pt reassured STDs are not a great concern with this dx - fluconazole (DIFLUCAN) 100 MG tablet; 1 tab every other day for 3 doses  Dispense: 3 tablet; Refill: 0 - clotrimazole-betamethasone (LOTRISONE) cream; Apply 1 application topically 2 (two) times daily.  Dispense: 30 g; Refill: 0   Partially dictated using Editor, commissioning. Any errors are unintentional.  Halina Maidens, MD [facility]  03/31/2019

## 2019-05-23 ENCOUNTER — Other Ambulatory Visit: Payer: Self-pay | Admitting: Internal Medicine

## 2019-05-23 DIAGNOSIS — B356 Tinea cruris: Secondary | ICD-10-CM

## 2019-05-28 ENCOUNTER — Encounter: Payer: Self-pay | Admitting: Internal Medicine

## 2019-05-28 ENCOUNTER — Ambulatory Visit (INDEPENDENT_AMBULATORY_CARE_PROVIDER_SITE_OTHER): Payer: Medicare Other | Admitting: Internal Medicine

## 2019-05-28 DIAGNOSIS — R21 Rash and other nonspecific skin eruption: Secondary | ICD-10-CM

## 2019-05-28 NOTE — Progress Notes (Signed)
Date:  05/28/2019   Name:  Nicholas [name]   DOB:  Sep 14, 1966   MRN:  [id number]  I connected with this patient, Nicholas [name], by telephone at the patient's home.  I verified that I am speaking with the correct person using two identifiers. This visit was conducted via telephone due to the Covid-19 outbreak from my office at [facility] in [place], [place]. I discussed the limitations, risks, security and privacy concerns of performing an evaluation and management service by telephone. I also discussed with the patient that there may be a patient responsible charge related to this service. The patient expressed understanding and agreed to proceed.  Chief Complaint: Rash (Under L) arm pit. Started Sunday. Yard work over the weekend. Allergic to poison ivy. Itching and red. No pain. Cortisone is being used for itching. Helping some. )  Rash This is a new problem. The current episode started in the past 7 days. The affected locations include the left axilla. The rash is characterized by redness and itchiness. He was exposed to plant contact (weeds and vines in the yard). Pertinent negatives include no fatigue, fever or shortness of breath. Past treatments include anti-itch cream.  The rash is no where else on his body.  The cortisone cream has been helping, after he changed from clotrimazole cream on the instruction of the pharmacist.  Review of Systems  Constitutional: Negative for chills, fatigue and fever.  Respiratory: Negative for shortness of breath.   Cardiovascular: Negative for chest pain.  Skin: Positive for color change and rash.  Psychiatric/Behavioral: Positive for sleep disturbance.    Patient Active Problem List   Diagnosis Date Noted  . Mild hyperlipidemia 01/22/2019  . Hyperglycemia 07/14/2017  . Leukocytosis 07/14/2017  . Chronic right shoulder pain 03/27/2017  . Severe episode of recurrent major depressive disorder, without psychotic features ([place]) 07/18/2016   . Generalized anxiety disorder 07/18/2016  . ED (erectile dysfunction) 06/17/2015  . Essential (primary) hypertension 06/17/2015  . Enlarged prostate 06/17/2015    No Known Allergies  Past Surgical History:  Procedure Laterality Date  . COLONOSCOPY WITH PROPOFOL N/A 02/16/2018   Procedure: COLONOSCOPY WITH PROPOFOL;  Surgeon: Lucilla Lame, MD;  Location: [facility];  Service: Endoscopy;  Laterality: N/A;  . SHOULDER OPEN ROTATOR CUFF REPAIR Right 03/2016  . shoulder surgery Right     Social History   Tobacco Use  . Smoking status: Never Smoker  . Smokeless tobacco: Never Used  . Tobacco comment: smoking cessation materials not required  Substance Use Topics  . Alcohol use: No  . Drug use: No     Medication list has been reviewed and updated.  Current Meds  Medication Sig  . amLODipine (NORVASC) 10 MG tablet Take 1 tablet (10 mg total) by mouth daily.  Marland Kitchen buPROPion (WELLBUTRIN XL) 150 MG 24 hr tablet Take 2 tablets by mouth daily. [facility].  . clotrimazole-betamethasone (LOTRISONE) cream APPLY TO AFFECTED AREA TWICE A DAY  . cyclobenzaprine (FLEXERIL) 10 MG tablet Take 10 mg by mouth 3 (three) times daily as needed. Reported on 05/09/2016  . pregabalin (LYRICA) 150 MG capsule Take 150 mg by mouth daily.  . sildenafil (REVATIO) 20 MG tablet Take 1 tablet (20 mg total) by mouth daily as needed.  . traZODone (DESYREL) 100 MG tablet TAKE 1 TABLET BY MOUTH EVERYDAY AT BEDTIME  . venlafaxine XR (EFFEXOR-XR) 150 MG 24 hr capsule TAKE 1 CAPSULE BY MOUTH EVERY DAY    PHQ  2/9 Scores 05/28/2019 03/31/2019 01/20/2019 07/15/2018  PHQ - 2 Score 0 0 0 2  PHQ- 9 Score - - - 2    BP Readings from Last 3 Encounters:  03/31/19 140/80  01/20/19 130/78  07/20/18 128/82    Physical Exam Neurological:     Mental Status: He is alert.  Psychiatric:        Attention and Perception: Attention normal.        Mood and Affect: Mood normal.     Wt Readings from Last 3  Encounters:  03/31/19 176 lb (79.8 kg)  01/20/19 174 lb (78.9 kg)  07/20/18 178 lb (80.7 kg)    There were no vitals taken for this visit.  Assessment and Plan: 1. Rash Suspect this is a heat rash and not contact dermatitis Continue cortisone cream Avoid excessive heat and perspiration Call next week if not improved.  I spent 10 minutes on this encounter. Partially dictated using Editor, commissioning. Any errors are unintentional.  Halina Maidens, MD [facility]  05/28/2019

## 2019-06-01 ENCOUNTER — Telehealth: Payer: Self-pay | Admitting: Internal Medicine

## 2019-06-01 ENCOUNTER — Other Ambulatory Visit: Payer: Self-pay

## 2019-06-01 ENCOUNTER — Ambulatory Visit (INDEPENDENT_AMBULATORY_CARE_PROVIDER_SITE_OTHER): Payer: Medicare Other | Admitting: Internal Medicine

## 2019-06-01 ENCOUNTER — Encounter: Payer: Self-pay | Admitting: Internal Medicine

## 2019-06-01 VITALS — BP 128/78 | HR 77 | Ht 71.0 in | Wt 176.0 lb

## 2019-06-01 DIAGNOSIS — B354 Tinea corporis: Secondary | ICD-10-CM

## 2019-06-01 DIAGNOSIS — L0390 Cellulitis, unspecified: Secondary | ICD-10-CM | POA: Diagnosis not present

## 2019-06-01 MED ORDER — FLUCONAZOLE 100 MG PO TABS: 100.0000 mg | ORAL_TABLET | Freq: Every day | ORAL | 0 refills | Status: DC

## 2019-06-01 MED ORDER — SULFAMETHOXAZOLE-TRIMETHOPRIM 800-160 MG PO TABS: 1.0000 | ORAL_TABLET | Freq: Two times a day (BID) | ORAL | 0 refills | Status: DC

## 2019-06-01 NOTE — Progress Notes (Signed)
Date:  06/01/2019   Name:  Nicholas [name]   DOB:  1966-02-10   MRN:  [id number]   Chief Complaint: Rash (Rash is getting no better using cortisone. )  Rash This is a new problem. The current episode started in the past 7 days. The problem has been gradually worsening since onset. The affected locations include the left axilla. The rash is characterized by itchiness and redness. He was exposed to nothing. Pertinent negatives include no fatigue, fever or shortness of breath. Past treatments include topical steroids. The treatment provided no relief.    Review of Systems  Constitutional: Negative for chills, fatigue and fever.  Respiratory: Negative for shortness of breath.   Cardiovascular: Negative for chest pain.  Skin: Positive for rash.    Patient Active Problem List   Diagnosis Date Noted  . Mild hyperlipidemia 01/22/2019  . Hyperglycemia 07/14/2017  . Leukocytosis 07/14/2017  . Chronic right shoulder pain 03/27/2017  . Severe episode of recurrent major depressive disorder, without psychotic features ([place]) 07/18/2016  . Generalized anxiety disorder 07/18/2016  . ED (erectile dysfunction) 06/17/2015  . Essential (primary) hypertension 06/17/2015  . Enlarged prostate 06/17/2015    No Known Allergies  Past Surgical History:  Procedure Laterality Date  . COLONOSCOPY WITH PROPOFOL N/A 02/16/2018   Procedure: COLONOSCOPY WITH PROPOFOL;  Surgeon: Lucilla Lame, MD;  Location: [facility];  Service: Endoscopy;  Laterality: N/A;  . SHOULDER OPEN ROTATOR CUFF REPAIR Right 03/2016  . shoulder surgery Right     Social History   Tobacco Use  . Smoking status: Never Smoker  . Smokeless tobacco: Never Used  . Tobacco comment: smoking cessation materials not required  Substance Use Topics  . Alcohol use: No  . Drug use: No     Medication list has been reviewed and updated.  Current Meds  Medication Sig  . amLODipine (NORVASC) 10 MG tablet Take 1 tablet (10 mg  total) by mouth daily.  Marland Kitchen buPROPion (WELLBUTRIN XL) 150 MG 24 hr tablet Take 2 tablets by mouth daily. [facility]  . clotrimazole-betamethasone (LOTRISONE) cream APPLY TO AFFECTED AREA TWICE A DAY  . cyclobenzaprine (FLEXERIL) 10 MG tablet Take 10 mg by mouth 3 (three) times daily as needed. Reported on 05/09/2016  . hydrocortisone cream 1 % Apply 1 application topically 2 (two) times daily.  . Omega-3 Fatty Acids (FISH OIL) 1000 MG CPDR Take by mouth.  . pregabalin (LYRICA) 150 MG capsule Take 150 mg by mouth daily.  . sildenafil (REVATIO) 20 MG tablet Take 1 tablet (20 mg total) by mouth daily as needed.  . traZODone (DESYREL) 100 MG tablet TAKE 1 TABLET BY MOUTH EVERYDAY AT BEDTIME  . venlafaxine XR (EFFEXOR-XR) 150 MG 24 hr capsule TAKE 1 CAPSULE BY MOUTH EVERY DAY    PHQ 2/9 Scores 06/01/2019 05/28/2019 03/31/2019 01/20/2019  PHQ - 2 Score 0 0 0 0  PHQ- 9 Score 0 - - -    BP Readings from Last 3 Encounters:  06/01/19 128/78  03/31/19 140/80  01/20/19 130/78    Physical Exam Vitals signs and nursing note reviewed.  Constitutional:      General: He is not in acute distress.    Appearance: He is well-developed.  HENT:     Head: Normocephalic and atraumatic.  Cardiovascular:     Rate and Rhythm: Normal rate and regular rhythm.  Pulmonary:     Effort: Pulmonary effort is normal. No respiratory distress.     Breath  sounds: No wheezing or rhonchi.  Musculoskeletal: Normal range of motion.  Skin:    General: Skin is warm and dry.     Findings: No rash.     Comments: Darkly Erythematous confluent rash in left axilla with several satellite lesions; numerous pin point raised papules/vesicles; no odor or drainage No tenderness  Neurological:     Mental Status: He is alert and oriented to person, place, and time.  Psychiatric:        Attention and Perception: Attention normal.        Mood and Affect: Mood normal.        Behavior: Behavior normal.        Thought  Content: Thought content normal.     Wt Readings from Last 3 Encounters:  06/01/19 176 lb (79.8 kg)  03/31/19 176 lb (79.8 kg)  01/20/19 174 lb (78.9 kg)    BP 128/78   Pulse 77   Ht 5\' 11"  (1.803 m)   Wt 176 lb (79.8 kg)   SpO2 97%   BMI 24.55 kg/m   Assessment and Plan: 1. Tinea corporis Will treat with oral antifungals Benadryl at night for itching - fluconazole (DIFLUCAN) 100 MG tablet; Take 1 tablet (100 mg total) by mouth daily for 7 days.  Dispense: 7 tablet; Refill: 0  2. Cellulitis, unspecified cellulitis site Suspect bacterial superinfection - sulfamethoxazole-trimethoprim (BACTRIM DS) 800-160 MG tablet; Take 1 tablet by mouth 2 (two) times daily for 10 days.  Dispense: 20 tablet; Refill: 0   Partially dictated using Editor, commissioning. Any errors are unintentional.  Halina Maidens, MD [facility]  06/01/2019

## 2019-06-01 NOTE — Telephone Encounter (Signed)
Call pt back and schedule for today so we can see rash in person. Thank you.

## 2019-06-01 NOTE — Telephone Encounter (Signed)
Rash is not getting better

## 2019-06-01 NOTE — Patient Instructions (Signed)
Benadryl 25 mg - take one a night to reduce itching

## 2019-06-07 ENCOUNTER — Telehealth: Payer: Self-pay

## 2019-06-07 ENCOUNTER — Other Ambulatory Visit: Payer: Self-pay | Admitting: Internal Medicine

## 2019-06-07 DIAGNOSIS — R21 Rash and other nonspecific skin eruption: Secondary | ICD-10-CM

## 2019-06-07 NOTE — Progress Notes (Signed)
06/08/2019 11:14 AM   Nicholas [name] [birth year] [id number]  Referring provider: Glean Hess, MD [address] [place],  [place] [address]  Chief Complaint  Patient presents with  . Benign Prostatic Hypertrophy    HPI: Patient is a 53 year old male with a history of BPH with LU TS and erectile dysfunction who presents today for recheck on hematuria.    BPH WITH LUTS  (prostate and/or bladder) IPSS score: 1/2      Previous score: 0/1   Previous PVR: 0 mL.  No urinary complaints.  Denies any dysuria, hematuria or suprapubic pain.   Denies any recent fevers, chills, nausea or vomiting.  He does not have a family history of PCa. IPSS    Row Name 06/08/19 0900         International Prostate Symptom Score   How often have you had the sensation of not emptying your bladder?  Not at All     How often have you had to urinate less than every two hours?  Not at All     How often have you found you stopped and started again several times when you urinated?  Not at All     How often have you found it difficult to postpone urination?  Not at All     How often have you had a weak urinary stream?  Not at All     How often have you had to strain to start urination?  Not at All     How many times did you typically get up at night to urinate?  1 Time     Total IPSS Score  1       Quality of Life due to urinary symptoms   If you were to spend the rest of your life with your urinary condition just the way it is now how would you feel about that?  Mostly Satisfied        Score:  1-7 Mild 8-19 Moderate 20-35 Severe  Erectile dysfunction His SHIM score is 15, which is mild to moderate ED.   His previous SHIM score was 12.   He has been having difficulty with erections for the last few months.  His major complaints are achieving and maintaining erections.  His libido is preserved.   His risk factors for ED are age, BPH, HTN, anxiety, depression, antidepressants and pain  medication.  He denies any painful erections or curvatures with his erections.   He is no longer having spontaneous erections.  He is finding the sildenafil that is prescribed by his PCP effective.  SHIM    Row Name 06/08/19 0955         SHIM: Over the last 6 months:   How do you rate your confidence that you could get and keep an erection?  Low     When you had erections with sexual stimulation, how often were your erections hard enough for penetration (entering your partner)?  Sometimes (about half the time)     During sexual intercourse, how often were you able to maintain your erection after you had penetrated (entered) your partner?  Sometimes (about half the time)     During sexual intercourse, how difficult was it to maintain your erection to completion of intercourse?  Slightly Difficult     When you attempted sexual intercourse, how often was it satisfactory for you?  Sometimes (about half the time)       SHIM Total Score  SHIM  15        Score: 1-7 Severe ED 8-11 Moderate ED 12-16 Mild-Moderate ED 17-21 Mild ED 22-25 No ED   Hematuria (high risk) He had an incidence of gross hematuria after working in his yard for several hours in April.  Patient denied any dysuria or suprapubic/flank pain at that time.  Patient denied any fevers, chills, nausea or vomiting. At that time.  His UA at that time noted to have 3-10 RBC's and his urine culture grew out a likely skin contaminate.  He does not endorse any further episodes of gross hematuria.  His UA today is negative for AHM.  He does not have a prior history of recurrent urinary tract infections, nephrolithiasis, trauma to the genitourinary tract or malignancies of the genitourinary tract.He does not have a family medical history of nephrolithiasis, malignancies of the genitourinary tract or hematuria.  He is not a smoker.    PMH: Past Medical History:  Diagnosis Date  . Anxiety   . BPH (benign prostatic hyperplasia)   .  Depression   . ED (erectile dysfunction)   . HTN (hypertension)   . Low back pain   . Microscopic hematuria   . Nocturia   . Special screening for malignant neoplasms, colon   . Testicular pain, right 01/19/2018  . Urgency of micturation     Surgical History: Past Surgical History:  Procedure Laterality Date  . COLONOSCOPY WITH PROPOFOL N/A 02/16/2018   Procedure: COLONOSCOPY WITH PROPOFOL;  Surgeon: Lucilla Lame, MD;  Location: [facility];  Service: Endoscopy;  Laterality: N/A;  . SHOULDER OPEN ROTATOR CUFF REPAIR Right 03/2016  . shoulder surgery Right     Home Medications:  Allergies as of 06/08/2019   No Known Allergies     Medication List       Accurate as of June 08, 2019 11:14 AM. If you have any questions, ask your nurse or doctor.        STOP taking these medications   fluconazole 100 MG tablet Commonly known as: DIFLUCAN Stopped by: Guillaume Weninger, PA-C   sulfamethoxazole-trimethoprim 800-160 MG tablet Commonly known as: BACTRIM DS Stopped by: Skyeler Smola, PA-C     TAKE these medications   amLODipine 10 MG tablet Commonly known as: NORVASC Take 1 tablet (10 mg total) by mouth daily.   buPROPion 150 MG 24 hr tablet Commonly known as: WELLBUTRIN XL Take 2 tablets by mouth daily. [facility].   clotrimazole-betamethasone cream Commonly known as: LOTRISONE APPLY TO AFFECTED AREA TWICE A DAY   cyclobenzaprine 10 MG tablet Commonly known as: FLEXERIL Take 10 mg by mouth 3 (three) times daily as needed. Reported on 05/09/2016   Fish Oil 1000 MG Cpdr Take by mouth.   hydrocortisone cream 1 % Apply 1 application topically 2 (two) times daily.   pregabalin 150 MG capsule Commonly known as: LYRICA Take 150 mg by mouth daily.   sildenafil 20 MG tablet Commonly known as: REVATIO Take 1 tablet (20 mg total) by mouth daily as needed.   traZODone 100 MG tablet Commonly known as: DESYREL TAKE 1 TABLET BY MOUTH EVERYDAY AT BEDTIME    venlafaxine XR 150 MG 24 hr capsule Commonly known as: EFFEXOR-XR TAKE 1 CAPSULE BY MOUTH EVERY DAY       Allergies: No Known Allergies  Family History: Family History  Problem Relation Age of Onset  . CAD Mother   . COPD Mother   . Lung cancer Father   .  Healthy Sister   . Kidney disease Neg Hx   . Prostate cancer Neg Hx     Social History:  reports that he has never smoked. He has never used smokeless tobacco. He reports that he does not drink alcohol or use drugs.  ROS: UROLOGY Frequent Urination?: No Hard to postpone urination?: No Burning/pain with urination?: No Get up at night to urinate?: No Leakage of urine?: No Urine stream starts and stops?: No Trouble starting stream?: No Do you have to strain to urinate?: No Blood in urine?: Yes Urinary tract infection?: Yes Sexually transmitted disease?: No Injury to kidneys or bladder?: No Painful intercourse?: No Weak stream?: No Erection problems?: No Penile pain?: No  Gastrointestinal Nausea?: No Vomiting?: No Indigestion/heartburn?: No Diarrhea?: No Constipation?: No  Constitutional Fever: No Night sweats?: No Weight loss?: No Fatigue?: No  Skin Skin rash/lesions?: No Itching?: No  Eyes Blurred vision?: No Double vision?: No  Ears/Nose/Throat Sore throat?: No Sinus problems?: No  Hematologic/Lymphatic Swollen glands?: No Easy bruising?: No  Cardiovascular Leg swelling?: No Chest pain?: No  Respiratory Cough?: No Shortness of breath?: No  Endocrine Excessive thirst?: No  Musculoskeletal Back pain?: Yes Joint pain?: Yes  Neurological Headaches?: No Dizziness?: No  Psychologic Depression?: No Anxiety?: No  Physical Exam: BP 121/78 (BP Location: Left Arm, Patient Position: Sitting, Cuff Size: Normal)   Pulse 82   Ht 5\' 11"  (1.803 m)   Wt 170 lb (77.1 kg)   BMI 23.71 kg/m   Constitutional:  Well nourished. Alert and oriented, No acute distress. HEENT: [place] AT, moist  mucus membranes.  Trachea midline, no masses. Cardiovascular: No clubbing, cyanosis, or edema. Respiratory: Normal respiratory effort, no increased work of breathing. GI: Abdomen is soft, non tender, non distended, no abdominal masses. Liver and spleen not palpable.  No hernias appreciated.  Stool sample for occult testing is not indicated.   GU: No CVA tenderness.  No bladder fullness or masses.  Patient with circumcised phallus.  Urethral meatus is patent.  No penile discharge. No penile lesions or rashes. Scrotum without lesions, cysts, rashes and/or edema.  Testicles are located scrotally bilaterally. No masses are appreciated in the testicles. Left and right epididymis are normal. Rectal: Patient with  normal sphincter tone. Anus and perineum without scarring or rashes. No rectal masses are appreciated. Prostate is approximately 60 grams, no nodules are appreciated. Skin: No rashes, bruises or suspicious lesions. Lymph: No inguinal adenopathy. Neurologic: Grossly intact, no focal deficits, moving all 4 extremities. Psychiatric: Normal mood and affect.   Laboratory Data: PSA History  2.4 in 01/2013  35.9 in 05/2016  3.79 in 01/2018  3.5 in 01/2019    Lab Results  Component Value Date   WBC 7.0 01/20/2019   HGB 14.9 01/20/2019   HCT 43.2 01/20/2019   MCV 87 01/20/2019   PLT 268 01/20/2019    Lab Results  Component Value Date   CREATININE 1.15 01/20/2019    Lab Results  Component Value Date   PSA 2.5 02/15/2013     Lab Results  Component Value Date   TSH 2.430 01/19/2018       Component Value Date/Time   CHOL 212 (H) 01/20/2019 0956   HDL 46 01/20/2019 0956   CHOLHDL 4.6 01/20/2019 0956   LDLCALC 151 (H) 01/20/2019 0956    Results for orders placed or performed in visit on 03/08/19  CULTURE, URINE COMPREHENSIVE   Specimen: Urine   UR  Result Value Ref Range   Urine Culture,  Comprehensive Final report    Organism ID, Bacteria Comment    Chlamydia/Gonococcus/Trichomonas, NAA   Specimen: Urine   UR  Result Value Ref Range   Chlamydia by NAA Negative Negative   Gonococcus by NAA Negative Negative   Trich vag by NAA Negative Negative  Microscopic Examination   URINE  Result Value Ref Range   WBC, UA 0-5 0 - 5 /hpf   RBC 3-10 (A) 0 - 2 /hpf   Epithelial Cells (non renal) 0-10 0 - 10 /hpf   Bacteria, UA None seen None seen/Few  Urinalysis, Complete  Result Value Ref Range   Specific Gravity, UA 1.010 1.005 - 1.030   pH, UA 6.5 5.0 - 7.5   Color, UA Yellow Yellow   Appearance Ur Clear Clear   Leukocytes,UA Trace (A) Negative   Protein,UA Negative Negative/Trace   Glucose, UA Negative Negative   Ketones, UA Negative Negative   RBC, UA 3+ (A) Negative   Bilirubin, UA Negative Negative   Urobilinogen, Ur 0.2 0.2 - 1.0 mg/dL   Nitrite, UA Negative Negative   Microscopic Examination See below:    Urinalysis  Negative.   See Epic.   I have reviewed the labs.  Assessment & Plan:    1. Gross hematuria (high risk)  I explained to the patient that there are a number of causes that can be associated with blood in the urine, such as stones, BPH, UTI's, damage to the urinary tract and/or cancer. At this time, I felt that the patient warranted further urologic evaluation as he meets the new AUA guidelines for hematuria as a high risk individual with a history of gross hematuria. The AUA guidelines state that a CT urogram is the preferred imaging study to evaluate hematuria. I explained to the patient that a contrast material will be injected into a vein and that in rare instances, an allergic reaction can result and may even life threatening (1:100,000)  The patient denies any allergies to contrast, iodine and/or seafood  Following the imaging study,  I've recommended a cystoscopy. I described how this is performed, typically in an office setting with a flexible cystoscope. We described the risks, benefits, and possible side  effects, the most common of which is a minor amount of blood in the urine and/or burning which usually resolves in 24 to 48 hours.   The patient had the opportunity to ask questions which were answered. Based upon this discussion, the patient is willing to proceed. Therefore, I've ordered: a CT Urogram and cystoscopy. The patient will return following all of the above for discussion of the results.  UA Urine culture BUN + creatinine    2. Erectile dysfunction SHIM score is 15, it is improved Continue sildenafil as prescribed by Dr. Army Melia  3. BPH with LUTS IPSS score is 1/2, it is slightly worse Continue conservative management, avoiding bladder irritants and timed voiding's PSA drawn today    Return for CT Urogram report and cystoscopy in [place] .  These notes generated with voice recognition software. I apologize for typographical errors.  Zara Council, PA-C  [facility] [address] [place], [place] [address] [phone]

## 2019-06-07 NOTE — Telephone Encounter (Signed)
Called and Left VM informing this. Awaiting callback to see if he agrees to see a skin specialist for this.

## 2019-06-07 NOTE — Telephone Encounter (Signed)
Patient agreed. Would like to see Dr. Phillip Heal in [place].

## 2019-06-07 NOTE — Telephone Encounter (Signed)
Patient called saying he was seen last week for rash. Said benadryl is not giving him any relief. Wants to know what to do?  Please Advise.

## 2019-06-07 NOTE — Telephone Encounter (Signed)
He probably needs to see Dermatology if he is not improved.

## 2019-06-08 ENCOUNTER — Other Ambulatory Visit: Payer: Self-pay

## 2019-06-08 ENCOUNTER — Ambulatory Visit (INDEPENDENT_AMBULATORY_CARE_PROVIDER_SITE_OTHER): Payer: Medicare Other | Admitting: Urology

## 2019-06-08 ENCOUNTER — Encounter: Payer: Self-pay | Admitting: Urology

## 2019-06-08 VITALS — BP 121/78 | HR 82 | Ht 71.0 in | Wt 170.0 lb

## 2019-06-08 DIAGNOSIS — N138 Other obstructive and reflux uropathy: Secondary | ICD-10-CM | POA: Diagnosis not present

## 2019-06-08 DIAGNOSIS — R310 Gross hematuria: Secondary | ICD-10-CM | POA: Diagnosis not present

## 2019-06-08 DIAGNOSIS — N529 Male erectile dysfunction, unspecified: Secondary | ICD-10-CM | POA: Diagnosis not present

## 2019-06-08 DIAGNOSIS — L2389 Allergic contact dermatitis due to other agents: Secondary | ICD-10-CM | POA: Diagnosis not present

## 2019-06-08 DIAGNOSIS — N401 Enlarged prostate with lower urinary tract symptoms: Secondary | ICD-10-CM

## 2019-06-09 ENCOUNTER — Telehealth: Payer: Self-pay

## 2019-06-09 LAB — URINALYSIS, COMPLETE
Bilirubin, UA: NEGATIVE
Glucose, UA: NEGATIVE
Ketones, UA: NEGATIVE
Leukocytes,UA: NEGATIVE
Nitrite, UA: NEGATIVE
Protein,UA: NEGATIVE
RBC, UA: NEGATIVE
Specific Gravity, UA: <1.005 — ABNORMAL LOW (ref 1.005–1.030)
Urobilinogen, Ur: 0.2 mg/dL (ref 0.2–1.0)
pH, UA: 5.5 (ref 5.0–7.5)

## 2019-06-09 LAB — MICROSCOPIC EXAMINATION
Bacteria, UA: NONE SEEN
RBC: NONE SEEN /hpf (ref 0–2)

## 2019-06-09 LAB — PSA: Prostate Specific Ag, Serum: 3.3 ng/mL (ref 0.0–4.0)

## 2019-06-09 NOTE — Telephone Encounter (Signed)
Pt called office and I read message from Shannon °

## 2019-06-09 NOTE — Telephone Encounter (Signed)
Left pt mess to call 

## 2019-06-09 NOTE — Telephone Encounter (Signed)
-----   Message from Nori Riis, PA-C sent at 06/09/2019  7:35 AM EDT ----- Please let Mr. Schaller know that his PSA is stable at 3.3.

## 2019-06-10 LAB — BUN+CREAT
BUN/Creatinine Ratio: 10 (ref 9–20)
BUN: 14 mg/dL (ref 6–24)
Creatinine, Ser: 1.47 mg/dL — ABNORMAL HIGH (ref 0.76–1.27)
GFR calc Af Amer: 62 mL/min/{1.73_m2} (ref >59)
GFR calc non Af Amer: 53 mL/min/{1.73_m2} — ABNORMAL LOW (ref >59)

## 2019-06-10 LAB — SPECIMEN STATUS REPORT

## 2019-06-22 ENCOUNTER — Ambulatory Visit
Admission: RE | Admit: 2019-06-22 | Discharge: 2019-06-22 | Disposition: A | Discharge status: Home / Self Care | Payer: Medicare Other | Source: Ambulatory Visit | Attending: Urology | Admitting: Urology

## 2019-06-22 ENCOUNTER — Other Ambulatory Visit: Payer: Self-pay

## 2019-06-22 DIAGNOSIS — R319 Hematuria, unspecified: Secondary | ICD-10-CM | POA: Diagnosis not present

## 2019-06-22 DIAGNOSIS — R310 Gross hematuria: Secondary | ICD-10-CM

## 2019-06-22 HISTORY — DX: Unspecified asthma, uncomplicated: J45.909

## 2019-06-22 MED ORDER — IOHEXOL 300 MG/ML  SOLN
150.0000 mL | Freq: Once | INTRAMUSCULAR | Status: AC | PRN
  Administered 2019-06-22: 125 mL via INTRAVENOUS

## 2019-06-28 ENCOUNTER — Telehealth: Payer: Self-pay | Admitting: Urology

## 2019-06-28 NOTE — Telephone Encounter (Signed)
Pt called and states that he received his results via MyChart, he would like for someone to call him to make sure he is reading them correctly.Please advise

## 2019-06-29 NOTE — Telephone Encounter (Signed)
Patient informed of CT results-spoke with Zara Council, PA-verbalized understanding.

## 2019-07-08 ENCOUNTER — Other Ambulatory Visit: Payer: Self-pay

## 2019-07-08 DIAGNOSIS — R310 Gross hematuria: Secondary | ICD-10-CM

## 2019-07-09 ENCOUNTER — Encounter: Payer: Self-pay | Admitting: Urology

## 2019-07-09 ENCOUNTER — Other Ambulatory Visit: Payer: Self-pay

## 2019-07-09 ENCOUNTER — Other Ambulatory Visit
Admission: RE | Admit: 2019-07-09 | Discharge: 2019-07-09 | Disposition: A | Discharge status: Home / Self Care | Payer: Medicare Other | Attending: Urology | Admitting: Urology

## 2019-07-09 ENCOUNTER — Ambulatory Visit (INDEPENDENT_AMBULATORY_CARE_PROVIDER_SITE_OTHER): Payer: Medicare Other | Admitting: Urology

## 2019-07-09 VITALS — BP 150/90 | HR 79 | Ht 71.0 in | Wt 170.0 lb

## 2019-07-09 DIAGNOSIS — R310 Gross hematuria: Secondary | ICD-10-CM | POA: Diagnosis not present

## 2019-07-09 DIAGNOSIS — R3129 Other microscopic hematuria: Secondary | ICD-10-CM

## 2019-07-09 LAB — URINALYSIS, COMPLETE (UACMP) WITH MICROSCOPIC
Bacteria, UA: NONE SEEN
Bilirubin Urine: NEGATIVE
Glucose, UA: NEGATIVE mg/dL
Hgb urine dipstick: NEGATIVE
Ketones, ur: NEGATIVE mg/dL
Leukocytes,Ua: NEGATIVE
Nitrite: NEGATIVE
Protein, ur: NEGATIVE mg/dL
RBC / HPF: NONE SEEN RBC/hpf (ref 0–5)
Specific Gravity, Urine: <1.005 — ABNORMAL LOW (ref 1.005–1.030)
pH: 6 (ref 5.0–8.0)

## 2019-07-09 NOTE — Progress Notes (Signed)
   07/12/19  CC:  Chief Complaint  Patient presents with  . Cysto    HPI: 53 year old male who presents today for cystoscopy to complete his gross hematuria work-up.  Notably, the patient reports seeing blood in his urine after working in the yard.  In the interim, he is undergone CT urogram completed 06/22/2019.  This was unremarkable without GU pathology.  He also was noted to have microscopic hematuria in the office.  As such, the appropriate work-up was recommended.  He is asymptomatic today.  Blood pressure (!) 150/90, pulse 79, height 5\' 11"  (1.803 m), weight 170 lb (77.1 kg). NED. A&Ox3.   No respiratory distress   Abd soft, NT, ND Normal phallus with bilateral descended testicles  Cystoscopy Procedure Note  Patient identification was confirmed, informed consent was obtained, and patient was prepped using Betadine solution.  Lidocaine jelly was administered per urethral meatus.     Pre-Procedure: - Inspection reveals a normal caliber ureteral meatus.  Procedure: The flexible cystoscope was introduced without difficulty - No urethral strictures/lesions are present. - Enlarged prostate  - Normal bladder neck - Bilateral ureteral orifices identified - Bladder mucosa  reveals no ulcers, tumors, or lesions - No bladder stones - No trabeculation  Retroflexion unremarkable   Post-Procedure: - Patient tolerated the procedure well  Assessment/ Plan:  1. Gross hematuria Etiology unclear, may have been secondary to dehydration rather than true gross hematuria.  Despite this, he did have microscopic hematuria this underwent further evaluation.  CT urogram was reviewed again independently today, no evidence of GU pathology.  Cystoscopy was also fairly unremarkable.  Patient was reassured.  2. Microscopic hematuria As above  Follow-up with Zara Council in 6 months for recheck  Hollice Espy, MD

## 2019-07-12 ENCOUNTER — Encounter: Payer: Self-pay | Admitting: Urology

## 2019-07-27 ENCOUNTER — Ambulatory Visit: Payer: Self-pay | Admitting: Internal Medicine

## 2019-07-28 ENCOUNTER — Other Ambulatory Visit: Payer: Self-pay | Admitting: Internal Medicine

## 2019-07-28 DIAGNOSIS — I10 Essential (primary) hypertension: Secondary | ICD-10-CM

## 2019-09-10 ENCOUNTER — Ambulatory Visit: Payer: Medicare Other | Admitting: Internal Medicine

## 2019-12-31 ENCOUNTER — Other Ambulatory Visit: Payer: Self-pay | Admitting: Internal Medicine

## 2020-01-14 ENCOUNTER — Ambulatory Visit: Payer: Medicare Other | Admitting: Urology

## 2020-01-25 ENCOUNTER — Ambulatory Visit (INDEPENDENT_AMBULATORY_CARE_PROVIDER_SITE_OTHER): Payer: Medicare Other | Admitting: Internal Medicine

## 2020-01-25 ENCOUNTER — Other Ambulatory Visit: Payer: Self-pay

## 2020-01-25 ENCOUNTER — Encounter: Payer: Self-pay | Admitting: Internal Medicine

## 2020-01-25 VITALS — BP 132/64 | HR 78 | Temp 97.9°F | Ht 71.0 in | Wt 168.0 lb

## 2020-01-25 DIAGNOSIS — G8929 Other chronic pain: Secondary | ICD-10-CM

## 2020-01-25 DIAGNOSIS — E785 Hyperlipidemia, unspecified: Secondary | ICD-10-CM | POA: Diagnosis not present

## 2020-01-25 DIAGNOSIS — F332 Major depressive disorder, recurrent severe without psychotic features: Secondary | ICD-10-CM

## 2020-01-25 DIAGNOSIS — I10 Essential (primary) hypertension: Secondary | ICD-10-CM

## 2020-01-25 DIAGNOSIS — M25511 Pain in right shoulder: Secondary | ICD-10-CM | POA: Diagnosis not present

## 2020-01-25 DIAGNOSIS — N529 Male erectile dysfunction, unspecified: Secondary | ICD-10-CM | POA: Diagnosis not present

## 2020-01-25 LAB — POCT URINALYSIS DIPSTICK
Bilirubin, UA: NEGATIVE
Blood, UA: NEGATIVE
Glucose, UA: NEGATIVE
Ketones, UA: NEGATIVE
Leukocytes, UA: NEGATIVE
Nitrite, UA: NEGATIVE
Protein, UA: NEGATIVE
Spec Grav, UA: <=1.005 — AB (ref 1.010–1.025)
Urobilinogen, UA: 0.2 E.U./dL
pH, UA: 6 (ref 5.0–8.0)

## 2020-01-25 MED ORDER — SILDENAFIL CITRATE 20 MG PO TABS: 20.0000 mg | ORAL_TABLET | Freq: Every day | ORAL | 5 refills | Status: DC | PRN

## 2020-01-25 NOTE — Patient Instructions (Addendum)
Covid Vaccine Line: [place] Vaccine Line: [phone]

## 2020-01-25 NOTE — Progress Notes (Signed)
Date:  01/25/2020   Name:  Nicholas [name]   DOB:  Aug 30, 1966   MRN:  [id number]   Chief Complaint: Annual Exam (medicare yearly) Nicholas [name] is a 54 y.o. male who presents today for his Complete Annual Exam. He feels fairly well. He reports exercising walking. He reports he is sleeping fairly well.   Colonoscopy  01/2018  There is no immunization history on file for this patient.  Hypertension This is a chronic problem. The problem is controlled. Pertinent negatives include no chest pain, headaches, palpitations or shortness of breath. Past treatments include calcium channel blockers. The current treatment provides significant improvement.  Depression        This is a chronic (followed by Psychiatry) problem.The problem is unchanged.  Associated symptoms include no fatigue, no appetite change, no myalgias and no headaches.     The symptoms are aggravated by social issues.  Past treatments include SNRIs - Serotonin and norepinephrine reuptake inhibitors (effexor may have been changed by Psych several months ago but he is not sure of the new medication).  Compliance with treatment is good. Enlarged prostate - and history of hematuria - followed by Urology.  Hematuria has resolved.  His last PSA was normal.  He will follow up with Urology yearly.  Lab Results  Component Value Date   CREATININE 1.47 (H) 06/08/2019   BUN 14 06/08/2019   NA 142 01/20/2019   K 4.4 01/20/2019   CL 103 01/20/2019   CO2 24 01/20/2019   Lab Results  Component Value Date   CHOL 212 (H) 01/20/2019   HDL 46 01/20/2019   LDLCALC 151 (H) 01/20/2019   TRIG 76 01/20/2019   CHOLHDL 4.6 01/20/2019   Lab Results  Component Value Date   TSH 2.430 01/19/2018   No results found for: HGBA1C   Review of Systems  Constitutional: Negative for appetite change, chills, diaphoresis, fatigue and unexpected weight change.  HENT: Negative for hearing loss, tinnitus, trouble swallowing and voice change.     Eyes: Negative for visual disturbance.  Respiratory: Negative for choking, shortness of breath and wheezing.   Cardiovascular: Negative for chest pain, palpitations and leg swelling.  Gastrointestinal: Negative for abdominal pain, blood in stool, constipation and diarrhea.  Genitourinary: Negative for difficulty urinating, dysuria and frequency.  Musculoskeletal: Positive for arthralgias and back pain. Negative for myalgias.  Skin: Negative for color change and rash.  Neurological: Negative for dizziness, syncope and headaches.  Hematological: Negative for adenopathy.  Psychiatric/Behavioral: Positive for depression. Negative for dysphoric mood and sleep disturbance. The patient is not nervous/anxious.     Patient Active Problem List   Diagnosis Date Noted  . Mild hyperlipidemia 01/22/2019  . Leukocytosis 07/14/2017  . Chronic right shoulder pain 03/27/2017  . Severe episode of recurrent major depressive disorder, without psychotic features ([place]) 07/18/2016  . Generalized anxiety disorder 07/18/2016  . ED (erectile dysfunction) 06/17/2015  . Essential (primary) hypertension 06/17/2015  . Enlarged prostate 06/17/2015    No Known Allergies  Past Surgical History:  Procedure Laterality Date  . COLONOSCOPY WITH PROPOFOL N/A 02/16/2018   Procedure: COLONOSCOPY WITH PROPOFOL;  Surgeon: Lucilla Lame, MD;  Location: [place];  Service: Endoscopy;  Laterality: N/A;  . SHOULDER OPEN ROTATOR CUFF REPAIR Right 03/2016  . shoulder surgery Right     Social History   Tobacco Use  . Smoking status: Never Smoker  . Smokeless tobacco: Never Used  . Tobacco comment: smoking cessation materials not required  Substance Use Topics  . Alcohol use: No  . Drug use: No     Medication list has been reviewed and updated.  Current Meds  Medication Sig  . amLODipine (NORVASC) 10 MG tablet TAKE 1 TABLET BY MOUTH EVERY DAY  . cyclobenzaprine (FLEXERIL) 10 MG tablet Take 10 mg by mouth 3  (three) times daily as needed. Reported on 05/09/2016  . Omega-3 Fatty Acids (FISH OIL) 1000 MG CPDR Take by mouth.  . sildenafil (REVATIO) 20 MG tablet Take 1 tablet (20 mg total) by mouth daily as needed.  . traZODone (DESYREL) 100 MG tablet TAKE 1 TABLET BY MOUTH EVERYDAY AT BEDTIME  . venlafaxine XR (EFFEXOR-XR) 150 MG 24 hr capsule TAKE 1 CAPSULE BY MOUTH EVERY DAY    PHQ 2/9 Scores 01/25/2020 06/01/2019 05/28/2019 03/31/2019  PHQ - 2 Score 0 0 0 0  PHQ- 9 Score 0 0 - -    BP Readings from Last 3 Encounters:  01/25/20 132/64  07/09/19 (!) 150/90  06/08/19 121/78    Physical Exam Vitals and nursing note reviewed.  Constitutional:      Appearance: Normal appearance. He is well-developed.  HENT:     Head: Normocephalic.     Right Ear: Tympanic membrane, ear canal and external ear normal.     Left Ear: Tympanic membrane, ear canal and external ear normal.     Nose: Nose normal.     Mouth/Throat:     Pharynx: Uvula midline.  Eyes:     Conjunctiva/sclera: Conjunctivae normal.     Pupils: Pupils are equal, round, and reactive to light.  Neck:     Thyroid: No thyromegaly.     Vascular: No carotid bruit.  Cardiovascular:     Rate and Rhythm: Normal rate and regular rhythm.     Heart sounds: Normal heart sounds. No murmur.  Pulmonary:     Effort: Pulmonary effort is normal.     Breath sounds: Normal breath sounds. No wheezing.  Chest:     Breasts:        Right: No mass.        Left: No mass.  Abdominal:     General: Bowel sounds are normal.     Palpations: Abdomen is soft.     Tenderness: There is no abdominal tenderness.  Musculoskeletal:        General: Tenderness (right shoulder) present.     Cervical back: Normal range of motion and neck supple.     Right lower leg: No edema.     Left lower leg: No edema.  Lymphadenopathy:     Cervical: No cervical adenopathy.  Skin:    General: Skin is warm and dry.     Capillary Refill: Capillary refill takes less than 2 seconds.    Neurological:     General: No focal deficit present.     Mental Status: He is alert and oriented to person, place, and time.     Deep Tendon Reflexes: Reflexes are normal and symmetric.  Psychiatric:        Attention and Perception: Attention normal.        Mood and Affect: Mood normal.        Speech: Speech normal.        Behavior: Behavior normal.        Thought Content: Thought content normal.        Judgment: Judgment normal.     Wt Readings from Last 3 Encounters:  01/25/20 168 lb (76.2 kg)  07/09/19 170 lb (77.1 kg)  06/08/19 170 lb (77.1 kg)    BP 132/64   Pulse 78   Temp 97.9 F (36.6 C) (Oral)   Ht 5\' 11"  (1.803 m)   Wt 168 lb (76.2 kg)   SpO2 97%   BMI 23.43 kg/m   Assessment and Plan: 1. Essential (primary) hypertension Clinically stable exam with well controlled BP on lisinopril. Tolerating medications without side effects at this time. Pt to continue current regimen and low sodium diet; benefits of regular exercise as able discussed. - CBC with Differential/Platelet - Comprehensive metabolic panel  2. Severe episode of recurrent major depressive disorder, without psychotic features ([place]) Followed by Psych and doing well on medication. No side effects. Sleep is fairly good on trazodone. No SI/HI.  3. Mild hyperlipidemia Check labs and advise - Lipid panel  4. Chronic right shoulder pain On disability as a result  5. Erectile dysfunction, unspecified erectile dysfunction type - sildenafil (REVATIO) 20 MG tablet; Take 1 tablet (20 mg total) by mouth daily as needed.  Dispense: 30 tablet; Refill: 5   Partially dictated using Editor, commissioning. Any errors are unintentional.  Halina Maidens, MD [facility]  01/25/2020

## 2020-01-26 LAB — CBC WITH DIFFERENTIAL/PLATELET
Basophils Absolute: 0.1 10*3/uL (ref 0.0–0.2)
Basos: 1 %
EOS (ABSOLUTE): 0.3 10*3/uL (ref 0.0–0.4)
Eos: 4 %
Hematocrit: 45.4 % (ref 37.5–51.0)
Hemoglobin: 15.5 g/dL (ref 13.0–17.7)
Immature Grans (Abs): 0 10*3/uL (ref 0.0–0.1)
Immature Granulocytes: 0 %
Lymphocytes Absolute: 1.6 10*3/uL (ref 0.7–3.1)
Lymphs: 22 %
MCH: 30.7 pg (ref 26.6–33.0)
MCHC: 34.1 g/dL (ref 31.5–35.7)
MCV: 90 fL (ref 79–97)
Monocytes Absolute: 0.6 10*3/uL (ref 0.1–0.9)
Monocytes: 8 %
Neutrophils Absolute: 4.6 10*3/uL (ref 1.4–7.0)
Neutrophils: 65 %
Platelets: 287 10*3/uL (ref 150–450)
RBC: 5.05 x10E6/uL (ref 4.14–5.80)
RDW: 11.1 % — ABNORMAL LOW (ref 11.6–15.4)
WBC: 7.2 10*3/uL (ref 3.4–10.8)

## 2020-01-26 LAB — COMPREHENSIVE METABOLIC PANEL
ALT: 20 IU/L (ref 0–44)
AST: 37 IU/L (ref 0–40)
Albumin/Globulin Ratio: 1.6 (ref 1.2–2.2)
Albumin: 4.4 g/dL (ref 3.8–4.9)
Alkaline Phosphatase: 87 IU/L (ref 39–117)
BUN/Creatinine Ratio: 6 — ABNORMAL LOW (ref 9–20)
BUN: 6 mg/dL (ref 6–24)
Bilirubin Total: 1.3 mg/dL — ABNORMAL HIGH (ref 0.0–1.2)
CO2: 24 mmol/L (ref 20–29)
Calcium: 9.6 mg/dL (ref 8.7–10.2)
Chloride: 104 mmol/L (ref 96–106)
Creatinine, Ser: 1.08 mg/dL (ref 0.76–1.27)
GFR calc Af Amer: 89 mL/min/{1.73_m2} (ref >59)
GFR calc non Af Amer: 77 mL/min/{1.73_m2} (ref >59)
Globulin, Total: 2.7 g/dL (ref 1.5–4.5)
Glucose: 107 mg/dL — ABNORMAL HIGH (ref 65–99)
Potassium: 4.5 mmol/L (ref 3.5–5.2)
Sodium: 142 mmol/L (ref 134–144)
Total Protein: 7.1 g/dL (ref 6.0–8.5)

## 2020-01-26 LAB — LIPID PANEL
Chol/HDL Ratio: 3.4 ratio (ref 0.0–5.0)
Cholesterol, Total: 169 mg/dL (ref 100–199)
HDL: 49 mg/dL (ref >39)
LDL Chol Calc (NIH): 103 mg/dL — ABNORMAL HIGH (ref 0–99)
Triglycerides: 94 mg/dL (ref 0–149)
VLDL Cholesterol Cal: 17 mg/dL (ref 5–40)

## 2020-01-26 LAB — SPECIMEN STATUS REPORT

## 2020-02-17 DIAGNOSIS — Z23 Encounter for immunization: Secondary | ICD-10-CM | POA: Diagnosis not present

## 2020-03-16 DIAGNOSIS — Z23 Encounter for immunization: Secondary | ICD-10-CM | POA: Diagnosis not present

## 2020-03-17 ENCOUNTER — Telehealth: Payer: Self-pay | Admitting: Internal Medicine

## 2020-03-17 NOTE — Telephone Encounter (Signed)
Left message for patient to call back and schedule Medicare Annual Wellness Visit (AWV) either virtually/audio only or in office. Whichever the patients preference is.  Last AWV 07/15/18; please schedule at anytime with [facility] Health Advisor.

## 2020-06-01 ENCOUNTER — Telehealth: Payer: Self-pay | Admitting: Urology

## 2020-06-01 NOTE — Telephone Encounter (Signed)
Would you call Nicholas [name] and get him in for an appointment?  He is due for a follow up for hematuria.  He will also need a PSA prior to his appointment as well.

## 2020-06-06 ENCOUNTER — Other Ambulatory Visit: Payer: Self-pay | Admitting: Radiology

## 2020-06-06 DIAGNOSIS — N138 Other obstructive and reflux uropathy: Secondary | ICD-10-CM

## 2020-06-06 NOTE — Telephone Encounter (Signed)
Patient states he had workup for hematuria a year ago and does not want to schedule an appointment or have his PSA checked.

## 2020-06-12 ENCOUNTER — Encounter: Payer: Self-pay | Admitting: Urology

## 2020-06-13 ENCOUNTER — Ambulatory Visit (INDEPENDENT_AMBULATORY_CARE_PROVIDER_SITE_OTHER): Payer: Medicare Other | Admitting: Internal Medicine

## 2020-06-13 ENCOUNTER — Encounter: Payer: Self-pay | Admitting: Internal Medicine

## 2020-06-13 ENCOUNTER — Other Ambulatory Visit: Payer: Self-pay

## 2020-06-13 DIAGNOSIS — I10 Essential (primary) hypertension: Secondary | ICD-10-CM | POA: Diagnosis not present

## 2020-06-13 DIAGNOSIS — B356 Tinea cruris: Secondary | ICD-10-CM | POA: Diagnosis not present

## 2020-06-13 MED ORDER — CLOTRIMAZOLE-BETAMETHASONE 1-0.05 % EX CREA: TOPICAL_CREAM | Freq: Two times a day (BID) | CUTANEOUS | 0 refills | Status: DC

## 2020-06-13 MED ORDER — AMLODIPINE BESYLATE 10 MG PO TABS: 10.0000 mg | ORAL_TABLET | Freq: Every day | ORAL | 12 refills | Status: DC

## 2020-06-13 MED ORDER — FLUCONAZOLE 100 MG PO TABS: ORAL_TABLET | ORAL | 0 refills | Status: DC

## 2020-06-13 NOTE — Progress Notes (Signed)
Date:  06/13/2020   Name:  Nicholas [name]   DOB:  Dec 04, 1965   MRN:  [id number]   Chief Complaint: Rash (Rash in groin area. Itchy. Started Thursday.)  Rash This is a new problem. The current episode started in the past 7 days. The problem is unchanged. The affected locations include the groin. The rash is characterized by itchiness and redness. Pertinent negatives include no diarrhea, fatigue, fever or shortness of breath.  Hypertension This is a chronic problem. The problem is controlled. Pertinent negatives include no chest pain, headaches, palpitations or shortness of breath. Past treatments include calcium channel blockers. The current treatment provides significant improvement. There are no compliance problems.     Lab Results  Component Value Date   CREATININE 1.08 01/25/2020   BUN 6 01/25/2020   NA 142 01/25/2020   K 4.5 01/25/2020   CL 104 01/25/2020   CO2 24 01/25/2020   Lab Results  Component Value Date   CHOL 169 01/25/2020   HDL 49 01/25/2020   LDLCALC 103 (H) 01/25/2020   TRIG 94 01/25/2020   CHOLHDL 3.4 01/25/2020   Lab Results  Component Value Date   TSH 2.430 01/19/2018   No results found for: HGBA1C Lab Results  Component Value Date   WBC 7.2 01/25/2020   HGB 15.5 01/25/2020   HCT 45.4 01/25/2020   MCV 90 01/25/2020   PLT 287 01/25/2020   Lab Results  Component Value Date   ALT 20 01/25/2020   AST 37 01/25/2020   ALKPHOS 87 01/25/2020   BILITOT 1.3 (H) 01/25/2020     Review of Systems  Constitutional: Negative for chills, fatigue and fever.  Eyes: Negative for visual disturbance.  Respiratory: Negative for chest tightness and shortness of breath.   Cardiovascular: Negative for chest pain, palpitations and leg swelling.  Gastrointestinal: Negative for constipation and diarrhea.  Musculoskeletal: Negative for gait problem and myalgias.  Skin: Positive for rash.  Neurological: Negative for dizziness, tremors, speech difficulty,  weakness, light-headedness and headaches.  Psychiatric/Behavioral: Negative for dysphoric mood and sleep disturbance.    Patient Active Problem List   Diagnosis Date Noted  . Mild hyperlipidemia 01/22/2019  . Leukocytosis 07/14/2017  . Chronic right shoulder pain 03/27/2017  . Severe episode of recurrent major depressive disorder, without psychotic features ([place]) 07/18/2016  . Generalized anxiety disorder 07/18/2016  . ED (erectile dysfunction) 06/17/2015  . Essential (primary) hypertension 06/17/2015  . Enlarged prostate 06/17/2015    No Known Allergies  Past Surgical History:  Procedure Laterality Date  . COLONOSCOPY WITH PROPOFOL N/A 02/16/2018   Procedure: COLONOSCOPY WITH PROPOFOL;  Surgeon: Lucilla Lame, MD;  Location: [place];  Service: Endoscopy;  Laterality: N/A;  . SHOULDER OPEN ROTATOR CUFF REPAIR Right 03/2016  . shoulder surgery Right     Social History   Tobacco Use  . Smoking status: Never Smoker  . Smokeless tobacco: Never Used  . Tobacco comment: smoking cessation materials not required  Vaping Use  . Vaping Use: Never used  Substance Use Topics  . Alcohol use: No  . Drug use: No     Medication list has been reviewed and updated.  Current Meds  Medication Sig  . amLODipine (NORVASC) 10 MG tablet TAKE 1 TABLET BY MOUTH EVERY DAY  . cyclobenzaprine (FLEXERIL) 10 MG tablet Take 10 mg by mouth 3 (three) times daily as needed. Reported on 05/09/2016  . Omega-3 Fatty Acids (FISH OIL) 1000 MG CPDR Take by mouth.  Marland Kitchen  pregabalin (LYRICA) 150 MG capsule Take 150 mg by mouth daily.  . sildenafil (REVATIO) 20 MG tablet Take 1 tablet (20 mg total) by mouth daily as needed.  . traZODone (DESYREL) 100 MG tablet TAKE 1 TABLET BY MOUTH EVERYDAY AT BEDTIME  . venlafaxine XR (EFFEXOR-XR) 150 MG 24 hr capsule TAKE 1 CAPSULE BY MOUTH EVERY DAY    PHQ 2/9 Scores 06/13/2020 01/25/2020 06/01/2019 05/28/2019  PHQ - 2 Score 0 0 0 0  PHQ- 9 Score 0 0 0 -    GAD 7 :  Generalized Anxiety Score 06/13/2020  Nervous, Anxious, on Edge 0  Control/stop worrying 0  Worry too much - different things 0  Trouble relaxing 0  Restless 0  Easily annoyed or irritable 0  Afraid - awful might happen 0  Total GAD 7 Score 0  Anxiety Difficulty Not difficult at all    BP Readings from Last 3 Encounters:  06/13/20 122/74  01/25/20 132/64  07/09/19 (!) 150/90    Physical Exam Vitals and nursing note reviewed.  Constitutional:      General: He is not in acute distress.    Appearance: He is well-developed.  HENT:     Head: Normocephalic and atraumatic.  Cardiovascular:     Rate and Rhythm: Normal rate and regular rhythm.     Pulses: Normal pulses.     Heart sounds: No murmur heard.   Pulmonary:     Effort: Pulmonary effort is normal. No respiratory distress.     Breath sounds: No wheezing or rhonchi.  Abdominal:     General: Abdomen is flat.     Palpations: Abdomen is soft.  Musculoskeletal:     Cervical back: Normal range of motion.     Right lower leg: No edema.     Left lower leg: No edema.  Lymphadenopathy:     Cervical: No cervical adenopathy.  Skin:    General: Skin is warm and dry.     Capillary Refill: Capillary refill takes less than 2 seconds.     Findings: No rash.  Neurological:     General: No focal deficit present.     Mental Status: He is alert and oriented to person, place, and time.  Psychiatric:        Mood and Affect: Mood normal.        Behavior: Behavior normal.        Thought Content: Thought content normal.     Wt Readings from Last 3 Encounters:  06/13/20 169 lb (76.7 kg)  01/25/20 168 lb (76.2 kg)  07/09/19 170 lb (77.1 kg)    BP 122/74 (BP Location: Right Arm, Patient Position: Sitting, Cuff Size: Normal)   Pulse 79   Temp 98 F (36.7 C) (Oral)   Ht 5\' 11"  (1.803 m)   Wt 169 lb (76.7 kg)   SpO2 97%   BMI 23.57 kg/m   Assessment and Plan: 1. Tinea cruris Dry area thoroughly then apply cream  -  clotrimazole-betamethasone (LOTRISONE) cream; Apply topically 2 (two) times daily. To groin rash  Dispense: 30 g; Refill: 0 - fluconazole (DIFLUCAN) 100 MG tablet; 1 tab every other day for 3 doses  Dispense: 3 tablet; Refill: 0  2. Essential (primary) hypertension Clinically stable exam with well controlled BP on amlodipine. Tolerating medications without side effects at this time. Pt to continue current regimen and low sodium diet; benefits of regular exercise as able discussed. - amLODipine (NORVASC) 10 MG tablet; Take 1 tablet (10 mg total)  by mouth daily.  Dispense: 30 tablet; Refill: 12   Partially dictated using Editor, commissioning. Any errors are unintentional.  Halina Maidens, MD [facility]  06/13/2020

## 2020-06-19 ENCOUNTER — Encounter: Payer: Self-pay | Admitting: Urology

## 2020-07-07 ENCOUNTER — Other Ambulatory Visit: Payer: Self-pay

## 2020-07-07 ENCOUNTER — Encounter: Payer: Self-pay | Admitting: Emergency Medicine

## 2020-07-07 ENCOUNTER — Ambulatory Visit
Admission: EM | Admit: 2020-07-07 | Discharge: 2020-07-07 | Disposition: A | Discharge status: Home / Self Care | Payer: Medicare Other | Attending: Family Medicine | Admitting: Family Medicine

## 2020-07-07 DIAGNOSIS — H00011 Hordeolum externum right upper eyelid: Secondary | ICD-10-CM | POA: Diagnosis not present

## 2020-07-07 MED ORDER — SULFAMETHOXAZOLE-TRIMETHOPRIM 800-160 MG PO TABS: 1.0000 | ORAL_TABLET | Freq: Two times a day (BID) | ORAL | 0 refills | Status: AC

## 2020-07-07 NOTE — ED Provider Notes (Signed)
[facility]    CSN: [id number] Arrival date & time: 07/07/20  0626      History   Chief Complaint Chief Complaint  Patient presents with  . Eye Problem    right    HPI Nicholas [name] is a 54 y.o. male.   Nicholas [name] presents with complaints of right upper eye lid swelling. Cut grass Tuesday 8/3,  felt something on eye, rubbed it, and noted following irritation. Looked in the mirror later and noted redness and swelling to the lid. Applied ice yesterday which didn't help. Took benadryl yesterday and the day prior which didn't help. Last night started itching. Applied cortisone and ice last night. This morning redness and swelling has persisted. No pain. Eye ball feels normal. Normal vision. No drainage or tearing. Denies any previous similar.    ROS per HPI, negative if not otherwise mentioned.      Past Medical History:  Diagnosis Date  . Anxiety   . Asthma   . BPH (benign prostatic hyperplasia)   . Depression   . ED (erectile dysfunction)   . HTN (hypertension)   . Low back pain   . Microscopic hematuria   . Nocturia   . Special screening for malignant neoplasms, colon   . Testicular pain, right 01/19/2018  . Urgency of micturation     Patient Active Problem List   Diagnosis Date Noted  . Mild hyperlipidemia 01/22/2019  . Leukocytosis 07/14/2017  . Chronic right shoulder pain 03/27/2017  . Severe episode of recurrent major depressive disorder, without psychotic features ([place]) 07/18/2016  . Generalized anxiety disorder 07/18/2016  . ED (erectile dysfunction) 06/17/2015  . Essential (primary) hypertension 06/17/2015  . Enlarged prostate 06/17/2015    Past Surgical History:  Procedure Laterality Date  . COLONOSCOPY WITH PROPOFOL N/A 02/16/2018   Procedure: COLONOSCOPY WITH PROPOFOL;  Surgeon: Lucilla Lame, MD;  Location: [place];  Service: Endoscopy;  Laterality: N/A;  . SHOULDER OPEN ROTATOR CUFF REPAIR Right 03/2016  .  shoulder surgery Right        Home Medications    Prior to Admission medications   Medication Sig Start Date End Date Taking? Authorizing Provider  amLODipine (NORVASC) 10 MG tablet Take 1 tablet (10 mg total) by mouth daily. 06/13/20  Yes Glean Hess, MD  clotrimazole-betamethasone (LOTRISONE) cream Apply topically 2 (two) times daily. To groin rash 06/13/20  Yes Glean Hess, MD  cyclobenzaprine (FLEXERIL) 10 MG tablet Take 10 mg by mouth 3 (three) times daily as needed. Reported on 05/09/2016 04/23/16  Yes [provider]  Omega-3 Fatty Acids (FISH OIL) 1000 MG CPDR Take by mouth.   Yes [provider]  sildenafil (REVATIO) 20 MG tablet Take 1 tablet (20 mg total) by mouth daily as needed. 01/25/20  Yes Glean Hess, MD  traZODone (DESYREL) 100 MG tablet TAKE 1 TABLET BY MOUTH EVERYDAY AT BEDTIME 10/09/18  Yes Glean Hess, MD  venlafaxine XR (EFFEXOR-XR) 150 MG 24 hr capsule TAKE 1 CAPSULE BY MOUTH EVERY DAY 12/31/19  Yes Glean Hess, MD  fluconazole (DIFLUCAN) 100 MG tablet 1 tab every other day for 3 doses 06/13/20   Glean Hess, MD  sulfamethoxazole-trimethoprim (BACTRIM DS) 800-160 MG tablet Take 1 tablet by mouth 2 (two) times daily for 7 days. 07/07/20 07/14/20  Zigmund Gottron, NP    Family History Family History  Problem Relation Age of Onset  . CAD Mother   . COPD Mother   .  Lung cancer Father   . Healthy Sister   . Kidney disease Neg Hx   . Prostate cancer Neg Hx     Social History Social History   Tobacco Use  . Smoking status: Never Smoker  . Smokeless tobacco: Never Used  . Tobacco comment: smoking cessation materials not required  Vaping Use  . Vaping Use: Never used  Substance Use Topics  . Alcohol use: No  . Drug use: No     Allergies   Patient has no known allergies.   Review of Systems Review of Systems   Physical Exam Triage Vital Signs ED Triage Vitals  Enc Vitals Group     BP 07/07/20 0954  123/88     Pulse Rate 07/07/20 0954 74     Resp 07/07/20 0954 18     Temp 07/07/20 0954 98 F (36.7 C)     Temp Source 07/07/20 0954 Oral     SpO2 07/07/20 0954 100 %     Weight 07/07/20 0953 169 lb 1.5 oz (76.7 kg)     Height 07/07/20 0953 5\' 11"  (1.803 m)     Head Circumference --      Peak Flow --      Pain Score 07/07/20 0953 0     Pain Loc --      Pain Edu? --      Excl. in [place]? --    No data found.  Updated Vital Signs BP 123/88 (BP Location: Left Arm)   Pulse 74   Temp 98 F (36.7 C) (Oral)   Resp 18   Ht 5\' 11"  (1.803 m)   Wt 169 lb 1.5 oz (76.7 kg)   SpO2 100%   BMI 23.58 kg/m   Visual Acuity Right Eye Distance:   Left Eye Distance:   Bilateral Distance:    Right Eye Near:   Left Eye Near:    Bilateral Near:     Physical Exam Constitutional:      Appearance: He is well-developed.  Eyes:     General:        Right eye: Hordeolum present. No discharge.     Extraocular Movements: Extraocular movements intact.     Pupils: Pupils are equal, round, and reactive to light.      Comments: Right upper lid with redness and swelling along lash line, which originates from mid aspect which is slightly tender; no drainage   Cardiovascular:     Rate and Rhythm: Normal rate.  Pulmonary:     Effort: Pulmonary effort is normal.  Skin:    General: Skin is warm and dry.  Neurological:     Mental Status: He is alert and oriented to person, place, and time.      UC Treatments / Results  Labs (all labs ordered are listed, but only abnormal results are displayed) Labs Reviewed - No data to display  EKG   Radiology No results found.  Procedures Procedures (including critical care time)  Medications Ordered in UC Medications - No data to display  Initial Impression / Assessment and Plan / UC Course  I have reviewed the triage vital signs and the nursing notes.  Pertinent labs & imaging results that were available during my care of the patient were reviewed  by me and considered in my medical decision making (see chart for details).     Exam is consistent with hordeolum, although insect bite also considered, with following redness, swelling and itching. It is tender on exam. Will  cover with antibiotics, and supportive cares recommended. Return precautions provided. Patient verbalized understanding and agreeable to plan.   Final Clinical Impressions(s) / UC Diagnoses   Final diagnoses:  Hordeolum of right upper eyelid, unspecified hordeolum type     Discharge Instructions     Warm compresses every few hours.  Antihistamine- zyrtec, claritin or benadryl.  Complete course of antibiotics.  May use very thin amount of hydrocortisone no more than twice a day.  Wash your lid line nightly with washcloth and mild soap.  If symptoms worsen or do not improve in the next week to return to be seen or to follow up with your PCP or your eye doctor.       ED Prescriptions    Medication Sig Dispense Auth. Provider   sulfamethoxazole-trimethoprim (BACTRIM DS) 800-160 MG tablet Take 1 tablet by mouth 2 (two) times daily for 7 days. 14 tablet Zigmund Gottron, NP     PDMP not reviewed this encounter.   Zigmund Gottron, NP 07/07/20 [phone]

## 2020-07-07 NOTE — ED Triage Notes (Signed)
Pt c/o right eye lid swelling, redness and itching. Started about 3 days ago. Denies pain. He states he was mowing grass when he finished he noticed his eye was itching in the inner corner. He has been using cold compresses and benadryl.

## 2020-07-07 NOTE — Discharge Instructions (Signed)
Warm compresses every few hours.  Antihistamine- zyrtec, claritin or benadryl.  Complete course of antibiotics.  May use very thin amount of hydrocortisone no more than twice a day.  Wash your lid line nightly with washcloth and mild soap.  If symptoms worsen or do not improve in the next week to return to be seen or to follow up with your PCP or your eye doctor.

## 2020-07-11 ENCOUNTER — Other Ambulatory Visit: Payer: Self-pay

## 2020-07-11 ENCOUNTER — Ambulatory Visit (INDEPENDENT_AMBULATORY_CARE_PROVIDER_SITE_OTHER): Payer: Medicare Other | Admitting: Internal Medicine

## 2020-07-11 ENCOUNTER — Encounter: Payer: Self-pay | Admitting: Internal Medicine

## 2020-07-11 VITALS — BP 122/78 | HR 92 | Ht 71.0 in | Wt 168.0 lb

## 2020-07-11 DIAGNOSIS — H00024 Hordeolum internum left upper eyelid: Secondary | ICD-10-CM | POA: Diagnosis not present

## 2020-07-11 NOTE — Progress Notes (Signed)
Date:  07/11/2020   Name:  Nicholas [name]   DOB:  04-24-1966   MRN:  [id number]   Chief Complaint: Eye Problem (On Day 5 of bactrim. Says it has gone down but still paiful. Uses Hot compresses and bendaryl.  )  Eye Pain  The left eye is affected. This is a new problem. The current episode started in the past 7 days. The problem occurs constantly. The problem has been unchanged. There was no injury mechanism. The pain is mild. There is no known exposure to pink eye. He does not wear contacts. Associated symptoms include eye redness, a foreign body sensation and itching. Pertinent negatives include no eye discharge, fever or photophobia. Treatments tried: Bactrim and compresses.    Lab Results  Component Value Date   CREATININE 1.08 01/25/2020   BUN 6 01/25/2020   NA 142 01/25/2020   K 4.5 01/25/2020   CL 104 01/25/2020   CO2 24 01/25/2020   Lab Results  Component Value Date   CHOL 169 01/25/2020   HDL 49 01/25/2020   LDLCALC 103 (H) 01/25/2020   TRIG 94 01/25/2020   CHOLHDL 3.4 01/25/2020   Lab Results  Component Value Date   TSH 2.430 01/19/2018   No results found for: HGBA1C Lab Results  Component Value Date   WBC 7.2 01/25/2020   HGB 15.5 01/25/2020   HCT 45.4 01/25/2020   MCV 90 01/25/2020   PLT 287 01/25/2020   Lab Results  Component Value Date   ALT 20 01/25/2020   AST 37 01/25/2020   ALKPHOS 87 01/25/2020   BILITOT 1.3 (H) 01/25/2020     Review of Systems  Constitutional: Negative for fever.  Eyes: Positive for pain, redness and itching. Negative for photophobia, discharge and visual disturbance.  Respiratory: Negative for chest tightness and shortness of breath.   Cardiovascular: Negative for chest pain.  Neurological: Negative for dizziness and headaches.    Patient Active Problem List   Diagnosis Date Noted  . Mild hyperlipidemia 01/22/2019  . Leukocytosis 07/14/2017  . Chronic right shoulder pain 03/27/2017  . Severe episode of  recurrent major depressive disorder, without psychotic features ([place]) 07/18/2016  . Generalized anxiety disorder 07/18/2016  . ED (erectile dysfunction) 06/17/2015  . Essential (primary) hypertension 06/17/2015  . Enlarged prostate 06/17/2015    No Known Allergies  Past Surgical History:  Procedure Laterality Date  . COLONOSCOPY WITH PROPOFOL N/A 02/16/2018   Procedure: COLONOSCOPY WITH PROPOFOL;  Surgeon: Lucilla Lame, MD;  Location: [facility];  Service: Endoscopy;  Laterality: N/A;  . SHOULDER OPEN ROTATOR CUFF REPAIR Right 03/2016  . shoulder surgery Right     Social History   Tobacco Use  . Smoking status: Never Smoker  . Smokeless tobacco: Never Used  . Tobacco comment: smoking cessation materials not required  Vaping Use  . Vaping Use: Never used  Substance Use Topics  . Alcohol use: No  . Drug use: No     Medication list has been reviewed and updated.  Current Meds  Medication Sig  . amLODipine (NORVASC) 10 MG tablet Take 1 tablet (10 mg total) by mouth daily.  . clotrimazole-betamethasone (LOTRISONE) cream Apply topically 2 (two) times daily. To groin rash  . cyclobenzaprine (FLEXERIL) 10 MG tablet Take 10 mg by mouth 3 (three) times daily as needed. Reported on 05/09/2016  . Omega-3 Fatty Acids (FISH OIL) 1000 MG CPDR Take by mouth.  . sildenafil (REVATIO) 20 MG tablet Take 1 tablet (20  mg total) by mouth daily as needed.  . sulfamethoxazole-trimethoprim (BACTRIM DS) 800-160 MG tablet Take 1 tablet by mouth 2 (two) times daily for 7 days.  . traZODone (DESYREL) 100 MG tablet TAKE 1 TABLET BY MOUTH EVERYDAY AT BEDTIME  . venlafaxine XR (EFFEXOR-XR) 150 MG 24 hr capsule TAKE 1 CAPSULE BY MOUTH EVERY DAY    PHQ 2/9 Scores 06/13/2020 01/25/2020 06/01/2019 05/28/2019  PHQ - 2 Score 0 0 0 0  PHQ- 9 Score 0 0 0 -    GAD 7 : Generalized Anxiety Score 06/13/2020  Nervous, Anxious, on Edge 0  Control/stop worrying 0  Worry too much - different things 0  Trouble  relaxing 0  Restless 0  Easily annoyed or irritable 0  Afraid - awful might happen 0  Total GAD 7 Score 0  Anxiety Difficulty Not difficult at all    BP Readings from Last 3 Encounters:  07/11/20 122/78  07/07/20 123/88  06/13/20 122/74    Physical Exam Vitals and nursing note reviewed.  Constitutional:      General: He is not in acute distress.    Appearance: He is well-developed.  HENT:     Head: Normocephalic and atraumatic.  Eyes:     General:        Left eye: Hordeolum present.No foreign body or discharge.     Extraocular Movements:     Right eye: Normal extraocular motion.     Left eye: Normal extraocular motion.   Pulmonary:     Effort: Pulmonary effort is normal. No respiratory distress.  Musculoskeletal:        General: Normal range of motion.  Skin:    General: Skin is warm and dry.     Findings: No rash.  Neurological:     Mental Status: He is alert and oriented to person, place, and time.  Psychiatric:        Behavior: Behavior normal.        Thought Content: Thought content normal.     Wt Readings from Last 3 Encounters:  07/11/20 168 lb (76.2 kg)  07/07/20 169 lb 1.5 oz (76.7 kg)  06/13/20 169 lb (76.7 kg)    BP 122/78   Pulse 92   Ht 5\' 11"  (1.803 m)   Wt 168 lb (76.2 kg)   SpO2 97%   BMI 23.43 kg/m   Assessment and Plan: 1. Hordeolum internum of left upper eyelid Continue Bactrim and compresses until evaluated by Ophthalmology - Ambulatory referral to Ophthalmology   Partially dictated using Dragon software. Any errors are unintentional.  Halina Maidens, MD [facility]  07/11/2020

## 2020-07-12 DIAGNOSIS — H00011 Hordeolum externum right upper eyelid: Secondary | ICD-10-CM | POA: Diagnosis not present

## 2020-07-18 ENCOUNTER — Ambulatory Visit: Payer: Medicare Other | Admitting: Internal Medicine

## 2020-08-05 ENCOUNTER — Other Ambulatory Visit: Payer: Self-pay | Admitting: Internal Medicine

## 2020-08-08 ENCOUNTER — Other Ambulatory Visit: Payer: Self-pay | Admitting: Internal Medicine

## 2020-08-08 DIAGNOSIS — N529 Male erectile dysfunction, unspecified: Secondary | ICD-10-CM

## 2020-08-10 DIAGNOSIS — H00011 Hordeolum externum right upper eyelid: Secondary | ICD-10-CM | POA: Diagnosis not present

## 2020-08-31 ENCOUNTER — Telehealth: Payer: Self-pay | Admitting: Internal Medicine

## 2020-08-31 NOTE — Telephone Encounter (Signed)
Left message for patient to call back and schedule Medicare Annual Wellness Visit (AWV) either virtually/audio only or in office. Whichever the patients preference is.  Last AWV 07/15/18; please schedule at anytime with [facility] Advisor.  This should be a 40 minute visit.

## 2020-09-30 ENCOUNTER — Other Ambulatory Visit: Payer: Self-pay | Admitting: Internal Medicine

## 2020-11-02 DIAGNOSIS — Z23 Encounter for immunization: Secondary | ICD-10-CM | POA: Diagnosis not present

## 2020-12-08 ENCOUNTER — Other Ambulatory Visit: Payer: Self-pay | Admitting: Internal Medicine

## 2020-12-08 DIAGNOSIS — N529 Male erectile dysfunction, unspecified: Secondary | ICD-10-CM

## 2020-12-08 NOTE — Telephone Encounter (Signed)
Requested Prescriptions  Pending Prescriptions Disp Refills  . sildenafil (REVATIO) 20 MG tablet [Pharmacy Med Name: SILDENAFIL 20 MG TABLET] 90 tablet 1    Sig: TAKE 1 TABLET BY MOUTH EVERY DAY AS NEEDED     Urology: Erectile Dysfunction Agents Passed - 12/08/2020 11:38 AM      Passed - Last BP in normal range    BP Readings from Last 1 Encounters:  07/11/20 122/78         Passed - Valid encounter within last 12 months    Recent Outpatient Visits          5 months ago Hordeolum internum of left upper eyelid   [facility] Glean Hess, MD   5 months ago Tinea cruris   [facility] Glean Hess, MD   10 months ago Essential (primary) hypertension   [facility] Glean Hess, MD   1 year ago Tinea corporis   [facility] Glean Hess, MD   1 year ago [place], MD      Future Appointments            In 1 month Army Melia Jesse Sans, MD [facility]           . sildenafil (REVATIO) 20 MG tablet [Pharmacy Med Name: SILDENAFIL 20 MG TABLET] 90 tablet 1    Sig: TAKE 1 TABLET BY MOUTH EVERY DAY AS NEEDED     Urology: Erectile Dysfunction Agents Passed - 12/08/2020 11:38 AM      Passed - Last BP in normal range    BP Readings from Last 1 Encounters:  07/11/20 122/78         Passed - Valid encounter within last 12 months    Recent Outpatient Visits          5 months ago Hordeolum internum of left upper eyelid   [place], [place]   5 months ago Tinea cruris   [facility] Glean Hess, MD   10 months ago Essential (primary) hypertension   [facility] Glean Hess, MD   1 year ago Tinea corporis   [facility] Glean Hess, MD   1 year ago [facility] Glean Hess, MD      Future Appointments            In 1 month Army Melia, Jesse Sans, MD [facility]

## 2020-12-13 ENCOUNTER — Ambulatory Visit (INDEPENDENT_AMBULATORY_CARE_PROVIDER_SITE_OTHER): Payer: Medicare (Managed Care)

## 2020-12-13 DIAGNOSIS — Z0000 Encounter for general adult medical examination without abnormal findings: Secondary | ICD-10-CM | POA: Diagnosis not present

## 2020-12-13 DIAGNOSIS — M5126 Other intervertebral disc displacement, lumbar region: Secondary | ICD-10-CM | POA: Insufficient documentation

## 2020-12-13 DIAGNOSIS — M4696 Unspecified inflammatory spondylopathy, lumbar region: Secondary | ICD-10-CM | POA: Insufficient documentation

## 2020-12-13 NOTE — Progress Notes (Signed)
Subjective:   Nicholas [name] is a 55 y.o. male who presents for Medicare Annual/Subsequent preventive examination.  Virtual Visit via Telephone Note  I connected with  Nicholas [name] on 12/13/20 at  1:20 PM EST by telephone and verified that I am speaking with the correct person using two identifiers.  Location: Patient: home Provider: [facility] Persons participating in the virtual visit: [place]   I discussed the limitations, risks, security and privacy concerns of performing an evaluation and management service by telephone and the availability of in person appointments. The patient expressed understanding and agreed to proceed.  Interactive audio and video telecommunications were attempted between this nurse and patient, however failed, due to patient having technical difficulties OR patient did not have access to video capability.  We continued and completed visit with audio only.  Some vital signs may be absent or patient reported.   [name], LPN    Review of Systems     Cardiac Risk Factors include: advanced age (>74men, >66 women);hypertension;male gender     Objective:    There were no vitals filed for this visit. There is no height or weight on file to calculate BMI.  Advanced Directives 12/13/2020 07/07/2020 07/15/2018 02/16/2018 10/10/2016  Does Patient Have a Medical Advance Directive? No No No No No  Would patient like information on creating a medical advance directive? No - Patient declined - Yes (MAU/Ambulatory/Procedural Areas - Information given) No - Patient declined No - patient declined information  Some encounter information is confidential and restricted. Go to Review Flowsheets activity to see all data.    Current Medications (verified) Outpatient Encounter Medications as of 12/13/2020  Medication Sig  . amLODipine (NORVASC) 10 MG tablet Take 1 tablet (10 mg total) by mouth daily.  . cyclobenzaprine (FLEXERIL) 10 MG tablet  Take 10 mg by mouth 3 (three) times daily as needed. Reported on 05/09/2016  . Omega-3 Fatty Acids (FISH OIL) 1000 MG CPDR Take by mouth.  . sildenafil (REVATIO) 20 MG tablet TAKE 1 TABLET BY MOUTH EVERY DAY AS NEEDED  . traZODone (DESYREL) 100 MG tablet TAKE 1 TABLET BY MOUTH EVERYDAY AT BEDTIME  . venlafaxine XR (EFFEXOR-XR) 150 MG 24 hr capsule TAKE 1 CAPSULE BY MOUTH EVERY DAY  . [DISCONTINUED] clotrimazole-betamethasone (LOTRISONE) cream Apply topically 2 (two) times daily. To groin rash   No facility-administered encounter medications on file as of 12/13/2020.    Allergies (verified) Patient has no known allergies.   History: Past Medical History:  Diagnosis Date  . Anxiety   . Asthma   . BPH (benign prostatic hyperplasia)   . Depression   . ED (erectile dysfunction)   . HTN (hypertension)   . Low back pain   . Microscopic hematuria   . Nocturia   . Special screening for malignant neoplasms, colon   . Testicular pain, right 01/19/2018  . Urgency of micturation    Past Surgical History:  Procedure Laterality Date  . COLONOSCOPY WITH PROPOFOL N/A 02/16/2018   Procedure: COLONOSCOPY WITH PROPOFOL;  Surgeon: Lucilla Lame, MD;  Location: [facility];  Service: Endoscopy;  Laterality: N/A;  . SHOULDER OPEN ROTATOR CUFF REPAIR Right 03/2016  . shoulder surgery Right    Family History  Problem Relation Age of Onset  . CAD Mother   . COPD Mother   . Lung cancer Father   . Healthy Sister   . Kidney disease Neg Hx   . Prostate cancer Neg Hx  Social History   Socioeconomic History  . Marital status: Divorced    Spouse name: Not on file  . Number of children: 2  . Years of education: some college  . Highest education level: 12th grade  Occupational History  . Occupation: Disabled  Tobacco Use  . Smoking status: Never Smoker  . Smokeless tobacco: Never Used  . Tobacco comment: smoking cessation materials not required  Vaping Use  . Vaping Use: Never used   Substance and Sexual Activity  . Alcohol use: No  . Drug use: No  . Sexual activity: Yes    Birth control/protection: None  Other Topics Concern  . Not on file  Social History Narrative   Pt lives alone   Social Determinants of Health   Financial Resource Strain: Low Risk   . Difficulty of Paying Living Expenses: Not hard at all  Food Insecurity: No Food Insecurity  . Worried About Charity fundraiser in the Last Year: Never true  . Ran Out of Food in the Last Year: Never true  Transportation Needs: No Transportation Needs  . Lack of Transportation (Medical): No  . Lack of Transportation (Non-Medical): No  Physical Activity: Sufficiently Active  . Days of Exercise per Week: 3 days  . Minutes of Exercise per Session: 60 min  Stress: No Stress Concern Present  . Feeling of Stress : Not at all  Social Connections: Socially Isolated  . Frequency of Communication with Friends and Family: More than three times a week  . Frequency of Social Gatherings with Friends and Family: More than three times a week  . Attends Religious Services: Never  . Active Member of Clubs or Organizations: No  . Attends Archivist Meetings: Never  . Marital Status: Divorced    Tobacco Counseling Counseling given: Not Answered Comment: smoking cessation materials not required   Clinical Intake:  Pre-visit preparation completed: Yes  Pain : No/denies pain     Nutritional Risks: None Diabetes: No  How often do you need to have someone help you when you read instructions, pamphlets, or other written materials from your doctor or pharmacy?: 1 - Never    Interpreter Needed?: No  Information entered by :: [name] LPN   Activities of Daily Living In your present state of health, do you have any difficulty performing the following activities: 12/13/2020 06/13/2020  Hearing? N N  Comment declines hearing aids -  Vision? N N  Difficulty concentrating or making decisions? N N   Walking or climbing stairs? N N  Dressing or bathing? N N  Doing errands, shopping? N N  Preparing Food and eating ? N -  Using the Toilet? N -  In the past six months, have you accidently leaked urine? N -  Do you have problems with loss of bowel control? N -  Managing your Medications? N -  Managing your Finances? N -  Housekeeping or managing your Housekeeping? N -  Some recent data might be hidden    Patient Care Team: Glean Hess, MD as PCP - General (Internal Medicine) [facility] spine and pain as Consulting Physician Abbie Sons, MD as Consulting Physician (Psychiatry) [facility], [facility] as Consulting Physician (Orthopedic Surgery) Bufford Buttner, MD as Consulting Physician (Orthopedic Surgery) Laneta Simmers as Physician Assistant (Urology)  Indicate any recent Medical Services you may have received from other than [facility] providers in the past year (date may be approximate).     Assessment:  This is a routine wellness examination for [employer].  Hearing/Vision screen  Hearing Screening   125Hz  250Hz  500Hz  1000Hz  2000Hz  3000Hz  4000Hz  6000Hz  8000Hz   Right ear:           Left ear:           Comments: Pt denies hearing difficulty  Vision Screening Comments: Annual vision screenings done by Dr. Edison Pace [facility]  Dietary issues and exercise activities discussed: Current Exercise Habits: Home exercise routine, Type of exercise: strength training/weights;calisthenics;walking, Time (Minutes): 60, Frequency (Times/Week): 3, Weekly Exercise (Minutes/Week): 180, Intensity: Moderate, Exercise limited by: None identified  Goals    . DIET - INCREASE WATER INTAKE     Recommend to drink at least 6-8 8oz glasses of water per day.      Depression Screen PHQ 2/9 Scores 12/13/2020 06/13/2020 01/25/2020 06/01/2019 05/28/2019 03/31/2019 01/20/2019  PHQ - 2 Score 0 0 0 0 0 0 0  PHQ- 9 Score - 0 0 0 - - -    Fall Risk Fall Risk  12/13/2020 06/13/2020  05/28/2019 03/31/2019 01/20/2019  Falls in the past year? 0 0 0 0 0  Number falls in past yr: 0 0 0 0 0  Injury with Fall? 0 0 0 0 0  Risk for fall due to : No Fall Risks No Fall Risks - - -  Follow up Falls prevention discussed Falls evaluation completed Falls evaluation completed Falls evaluation completed Falls evaluation completed    [place]:  Any stairs in or around the home? Yes  If so, are there any without handrails? No  Home free of loose throw rugs in walkways, pet beds, electrical cords, etc? Yes  Adequate lighting in your home to reduce risk of falls? Yes   ASSISTIVE DEVICES UTILIZED TO PREVENT FALLS:  Life alert? No  Use of a cane, walker or w/c? No  Grab bars in the bathroom? Yes  Shower chair or bench in shower? No  Elevated toilet seat or a handicapped toilet? No   TIMED UP AND GO:  Was the test performed? No . Telephonic visit.   Cognitive Function: Normal cognitive status assessed by direct observation by this Nurse Health Advisor. No abnormalities found.       6CIT Screen 07/15/2018  What Year? 0 points  What month? 0 points  What time? 0 points  Count back from 20 0 points  Months in reverse 0 points  Repeat phrase 4 points  Total Score 4    Immunizations Immunization History  Administered Date(s) Administered  . Moderna Sars-Covid-2 Vaccination 02/17/2020, 03/16/2020, 11/02/2020    TDAP status: Due, Education has been provided regarding the importance of this vaccine. Advised may receive this vaccine at local pharmacy or Health Dept. Aware to provide a copy of the vaccination record if obtained from local pharmacy or Health Dept. Verbalized acceptance and understanding.  Flu Vaccine status: Declined, Education has been provided regarding the importance of this vaccine but patient still declined. Advised may receive this vaccine at local pharmacy or Health Dept. Aware to provide a copy of the vaccination record if  obtained from local pharmacy or Health Dept. Verbalized acceptance and understanding.  Pneumococcal vaccine status: due at age 51  Covid-19 vaccine status: Completed vaccines  Qualifies for Shingles Vaccine? Yes   Zostavax completed No   Shingrix Completed?: No.    Education has been provided regarding the importance of this vaccine. Patient has been advised to call insurance company to  determine out of pocket expense if they have not yet received this vaccine. Advised may also receive vaccine at local pharmacy or Health Dept. Verbalized acceptance and understanding.  Screening Tests Health Maintenance  Topic Date Due  . HIV Screening  Never done  . TETANUS/TDAP  01/24/2021 (Originally 10/22/1985)  . COLONOSCOPY (Pts 45-46yrs Insurance coverage will need to be confirmed)  02/17/2028  . COVID-19 Vaccine  Completed  . Hepatitis C Screening  Completed  . INFLUENZA VACCINE  Discontinued    Health Maintenance  Health Maintenance Due  Topic Date Due  . HIV Screening  Never done    Colorectal cancer screening: Type of screening: Colonoscopy. Completed 02/16/18. Repeat every 10 years  Lung Cancer Screening: (Low Dose CT Chest recommended if Age 33-80 years, 30 pack-year currently smoking OR have quit w/in 15years.) does not qualify.   Additional Screening:  Hepatitis C Screening: does qualify; Completed 01/19/18  Vision Screening: Recommended annual ophthalmology exams for early detection of glaucoma and other disorders of the eye. Is the patient up to date with their annual eye exam?  Yes  Who is the provider or what is the name of the office in which the patient attends annual eye exams? Dr. Edison Pace [facility]  Dental Screening: Recommended annual dental exams for proper oral hygiene  Community Resource Referral / Chronic Care Management: CRR required this visit?  No   CCM required this visit?  No      Plan:     I have personally reviewed and noted the following in  the patient's chart:   . Medical and social history . Use of alcohol, tobacco or illicit drugs  . Current medications and supplements . Functional ability and status . Nutritional status . Physical activity . Advanced directives . List of other physicians . Hospitalizations, surgeries, and ER visits in previous 12 months . Vitals . Screenings to include cognitive, depression, and falls . Referrals and appointments  In addition, I have reviewed and discussed with patient certain preventive protocols, quality metrics, and best practice recommendations. A written personalized care plan for preventive services as well as general preventive health recommendations were provided to patient.     [name], LPN   [id number]   Nurse Notes: none

## 2020-12-13 NOTE — Patient Instructions (Signed)
Nicholas [name] , Thank you for taking time to come for your Medicare Wellness Visit. I appreciate your ongoing commitment to your health goals. Please review the following plan we discussed and let me know if I can assist you in the future.   Screening recommendations/referrals: Colonoscopy: done 02/16/18. Repeat in 2029 Recommended yearly ophthalmology/optometry visit for glaucoma screening and checkup Recommended yearly dental visit for hygiene and checkup  Vaccinations: Influenza vaccine: declined Pneumococcal vaccine: due age 70 Tdap vaccine: due Shingles vaccine: Shingrix discussed. Please contact your pharmacy for coverage information.  Covid-19:  Done 02/17/20, 03/16/20 & 11/02/20  Advanced directives: Advance directive discussed with you today. Even though you declined this today please call our office should you change your mind and we can give you the proper paperwork for you to fill out.  Conditions/risks identified: Keep up the great work!  Next appointment: Follow up in one year for your annual wellness visit   Preventive Care 40-64 Years, Male Preventive care refers to lifestyle choices and visits with your health care provider that can promote health and wellness. What does preventive care include?  A yearly physical exam. This is also called an annual well check.  Dental exams once or twice a year.  Routine eye exams. Ask your health care provider how often you should have your eyes checked.  Personal lifestyle choices, including:  Daily care of your teeth and gums.  Regular physical activity.  Eating a healthy diet.  Avoiding tobacco and drug use.  Limiting alcohol use.  Practicing safe sex.  Taking low-dose aspirin every day starting at age 57. What happens during an annual well check? The services and screenings done by your health care provider during your annual well check will depend on your age, overall health, lifestyle risk factors, and family history  of disease. Counseling  Your health care provider may ask you questions about your:  Alcohol use.  Tobacco use.  Drug use.  Emotional well-being.  Home and relationship well-being.  Sexual activity.  Eating habits.  Work and work Statistician. Screening  You may have the following tests or measurements:  Height, weight, and BMI.  Blood pressure.  Lipid and cholesterol levels. These may be checked every 5 years, or more frequently if you are over 45 years old.  Skin check.  Lung cancer screening. You may have this screening every year starting at age 70 if you have a 30-pack-year history of smoking and currently smoke or have quit within the past 15 years.  Fecal occult blood test (FOBT) of the stool. You may have this test every year starting at age 76.  Flexible sigmoidoscopy or colonoscopy. You may have a sigmoidoscopy every 5 years or a colonoscopy every 10 years starting at age 15.  Prostate cancer screening. Recommendations will vary depending on your family history and other risks.  Hepatitis C blood test.  Hepatitis B blood test.  Sexually transmitted disease (STD) testing.  Diabetes screening. This is done by checking your blood sugar (glucose) after you have not eaten for a while (fasting). You may have this done every 1-3 years. Discuss your test results, treatment options, and if necessary, the need for more tests with your health care provider. Vaccines  Your health care provider may recommend certain vaccines, such as:  Influenza vaccine. This is recommended every year.  Tetanus, diphtheria, and acellular pertussis (Tdap, Td) vaccine. You may need a Td booster every 10 years.  Zoster vaccine. You may need this after age 64.  Pneumococcal 13-valent conjugate (PCV13) vaccine. You may need this if you have certain conditions and have not been vaccinated.  Pneumococcal polysaccharide (PPSV23) vaccine. You may need one or two doses if you smoke  cigarettes or if you have certain conditions. Talk to your health care provider about which screenings and vaccines you need and how often you need them. This information is not intended to replace advice given to you by your health care provider. Make sure you discuss any questions you have with your health care provider. Document Released: 12/15/2015 Document Revised: 08/07/2016 Document Reviewed: 09/19/2015 Elsevier Interactive Patient Education  2017 [employer] Prevention in the Home Falls can cause injuries. They can happen to people of all ages. There are many things you can do to make your home safe and to help prevent falls. What can I do on the outside of my home?  Regularly fix the edges of walkways and driveways and fix any cracks.  Remove anything that might make you trip as you walk through a door, such as a raised step or threshold.  Trim any bushes or trees on the path to your home.  Use bright outdoor lighting.  Clear any walking paths of anything that might make someone trip, such as rocks or tools.  Regularly check to see if handrails are loose or broken. Make sure that both sides of any steps have handrails.  Any raised decks and porches should have guardrails on the edges.  Have any leaves, snow, or ice cleared regularly.  Use sand or salt on walking paths during winter.  Clean up any spills in your garage right away. This includes oil or grease spills. What can I do in the bathroom?  Use night lights.  Install grab bars by the toilet and in the tub and shower. Do not use towel bars as grab bars.  Use non-skid mats or decals in the tub or shower.  If you need to sit down in the shower, use a plastic, non-slip stool.  Keep the floor dry. Clean up any water that spills on the floor as soon as it happens.  Remove soap buildup in the tub or shower regularly.  Attach bath mats securely with double-sided non-slip rug tape.  Do not have throw rugs  and other things on the floor that can make you trip. What can I do in the bedroom?  Use night lights.  Make sure that you have a light by your bed that is easy to reach.  Do not use any sheets or blankets that are too big for your bed. They should not hang down onto the floor.  Have a firm chair that has side arms. You can use this for support while you get dressed.  Do not have throw rugs and other things on the floor that can make you trip. What can I do in the kitchen?  Clean up any spills right away.  Avoid walking on wet floors.  Keep items that you use a lot in easy-to-reach places.  If you need to reach something above you, use a strong step stool that has a grab bar.  Keep electrical cords out of the way.  Do not use floor polish or wax that makes floors slippery. If you must use wax, use non-skid floor wax.  Do not have throw rugs and other things on the floor that can make you trip. What can I do with my stairs?  Do not leave any items on the  stairs.  Make sure that there are handrails on both sides of the stairs and use them. Fix handrails that are broken or loose. Make sure that handrails are as long as the stairways.  Check any carpeting to make sure that it is firmly attached to the stairs. Fix any carpet that is loose or worn.  Avoid having throw rugs at the top or bottom of the stairs. If you do have throw rugs, attach them to the floor with carpet tape.  Make sure that you have a light switch at the top of the stairs and the bottom of the stairs. If you do not have them, ask someone to add them for you. What else can I do to help prevent falls?  Wear shoes that:  Do not have high heels.  Have rubber bottoms.  Are comfortable and fit you well.  Are closed at the toe. Do not wear sandals.  If you use a stepladder:  Make sure that it is fully opened. Do not climb a closed stepladder.  Make sure that both sides of the stepladder are locked into  place.  Ask someone to hold it for you, if possible.  Clearly mark and make sure that you can see:  Any grab bars or handrails.  First and last steps.  Where the edge of each step is.  Use tools that help you move around (mobility aids) if they are needed. These include:  Canes.  Walkers.  Scooters.  Crutches.  Turn on the lights when you go into a dark area. Replace any light bulbs as soon as they burn out.  Set up your furniture so you have a clear path. Avoid moving your furniture around.  If any of your floors are uneven, fix them.  If there are any pets around you, be aware of where they are.  Review your medicines with your doctor. Some medicines can make you feel dizzy. This can increase your chance of falling. Ask your doctor what other things that you can do to help prevent falls. This information is not intended to replace advice given to you by your health care provider. Make sure you discuss any questions you have with your health care provider. Document Released: 09/14/2009 Document Revised: 04/25/2016 Document Reviewed: 12/23/2014 Elsevier Interactive Patient Education  2017 [employer].

## 2020-12-19 ENCOUNTER — Telehealth: Payer: Self-pay

## 2020-12-19 NOTE — Telephone Encounter (Unsigned)
Copied from [place] [id number]. Topic: General - Inquiry >> Dec 19, 2020 10:18 AM Gillis Ends D wrote: Reason for CRM: patient needs a prior authorization from the insurance company. The medication that needs prior approval is sildenafil. Patient can be reached at [phone]. Please advise

## 2020-12-19 NOTE — Telephone Encounter (Signed)
PA completed waiting for insurance approval.  (Key: [id number])  [place]

## 2020-12-19 NOTE — Telephone Encounter (Signed)
PA completed waiting for approval from insurance.  KP

## 2020-12-20 NOTE — Telephone Encounter (Signed)
Insurance denied coverage.  KP 

## 2020-12-21 NOTE — Telephone Encounter (Signed)
Pt calling regarding this PA. He states that the insurance told him they would cover the medication. Pt is requesting to know what to do. Please advise.

## 2020-12-21 NOTE — Telephone Encounter (Signed)
Nicholas [name], from [facility], calling and is requesting to have confirmation number for this denial. She is requesting to have it resent. Please advise.      [phone]

## 2020-12-21 NOTE — Telephone Encounter (Signed)
Called [employer] they gave me a case# [id number]. They will fax over appeal form.   KP

## 2020-12-21 NOTE — Telephone Encounter (Signed)
Spoke to pt let him know that his insurance denied medication Pt stated that insurance told him when he signed up that they would cover ED medication. Pt will call insurance and is aware that he would need pay out of pocket or see a urologist. Offered to send pt to Urology. Pt refused at this time.   KP

## 2020-12-22 ENCOUNTER — Other Ambulatory Visit: Payer: Self-pay

## 2020-12-22 ENCOUNTER — Telehealth: Payer: Self-pay

## 2020-12-22 DIAGNOSIS — N529 Male erectile dysfunction, unspecified: Secondary | ICD-10-CM

## 2020-12-22 MED ORDER — SILDENAFIL CITRATE 20 MG PO TABS: 20.0000 mg | ORAL_TABLET | Freq: Every day | ORAL | 5 refills | Status: DC | PRN

## 2020-12-22 NOTE — Telephone Encounter (Signed)
Spoke to [facility] they stated that pt is only approved for 6 pills. Pt is wanting his amount increased for 30 days worth.   Told representative from [facility] that Dr. Army Melia is out of the office this week and next week as well and will return 1/31. Will need to discuss this with Dr. Army Melia.  KP

## 2020-12-22 NOTE — Telephone Encounter (Signed)
Recived appeals form from [facility].   Called pt he stated that when he called [facility] they told him that they did not see any information that a PA was completed and that a new PA needed to be sent. I called [facility] 12/21/20 and they told me that he needed to appeal the decision. Appeal form was faxed to the office. Offered to refer pt to a Urologist. Pt refused. Also told pt that he would then be responsible for covering the cost of the medication. Pt is going to call his insurance company again. Pt was unaware that a appeal needed to be done pt said he was informed that another PA needed to be sent in.  KP

## 2021-01-03 ENCOUNTER — Other Ambulatory Visit: Payer: Self-pay

## 2021-01-03 ENCOUNTER — Telehealth: Payer: Self-pay

## 2021-01-03 DIAGNOSIS — N529 Male erectile dysfunction, unspecified: Secondary | ICD-10-CM

## 2021-01-03 MED ORDER — SILDENAFIL CITRATE 100 MG PO TABS: 50.0000 mg | ORAL_TABLET | Freq: Every day | ORAL | 5 refills | Status: DC | PRN

## 2021-01-03 NOTE — Telephone Encounter (Signed)
Pt got a letter from insurance saying they cover 6 of the 100 mg and would like it sent in to [facility] in [place]  sildenafil (REVATIO) 20 MG tablet [[id number]]

## 2021-01-03 NOTE — Telephone Encounter (Signed)
Medication (100 MG) was sent in to pharmacy. 6 per 30 days   KP

## 2021-01-26 ENCOUNTER — Encounter: Payer: Medicare Other | Admitting: Internal Medicine

## 2021-02-12 ENCOUNTER — Telehealth: Payer: Self-pay

## 2021-02-12 ENCOUNTER — Telehealth: Payer: Self-pay | Admitting: Internal Medicine

## 2021-02-12 NOTE — Telephone Encounter (Signed)
Pt is stating that Pharmacy informed him that his insurance is requiring a PA on this script and also a script sent in monthly. States Pharmacy has sent request over already for PA to dr. Daryll Brod with pt with concerns.

## 2021-02-12 NOTE — Telephone Encounter (Signed)
Copied from [place] [id number]. Topic: Quick Communication - See Telephone Encounter >> Feb 12, 2021 11:24 AM Loma [place] wrote: CRM for notification. See Telephone encounter for: 02/12/21.sildenafil (VIAGRA) 100 MG tablet Medication Date: 01/03/2021 Department: [facility] Ordering/Authorizing: Glean Hess, MD  Pt is stating that Pharmacy informed him that his insurance is requiring a PA on this script and also a script sent in monthly. States  Pharmacy has sent request over already for PA to dr. Daryll Brod with pt with concerns.

## 2021-02-12 NOTE — Telephone Encounter (Signed)
Spoke to pt left him know that a PA was completed. PA was denied by insurance. Insurance is supposed to cover 6 per 30 days. Told pt to call insurance to find out whats going on. Pt verbalized understanding.  KP

## 2021-02-12 NOTE — Telephone Encounter (Signed)
Completed PA on Sildenafil 100 mg 10 tablets for 30 days.  Insurance ID: [id number]  GRP # [id number]  Key: [id number]  PA Case ID: [id number]  Awaiting outcome from insurance company.

## 2021-02-12 NOTE — Telephone Encounter (Signed)
Duplicate   KP 

## 2021-02-15 NOTE — Telephone Encounter (Signed)
Received PA back from patients insurance. PA denied 10 tablets for 30 days. They will only cover 6 tablets under insurance plan. Will inform patient and send in 6 pills for 30 days.  CM

## 2021-03-07 ENCOUNTER — Telehealth: Payer: Self-pay | Admitting: Internal Medicine

## 2021-03-09 ENCOUNTER — Other Ambulatory Visit: Payer: Self-pay | Admitting: Internal Medicine

## 2021-03-09 DIAGNOSIS — N529 Male erectile dysfunction, unspecified: Secondary | ICD-10-CM

## 2021-03-09 MED ORDER — SILDENAFIL CITRATE 20 MG PO TABS: 20.0000 mg | ORAL_TABLET | Freq: Every day | ORAL | 4 refills | Status: DC | PRN

## 2021-03-09 NOTE — Telephone Encounter (Signed)
Pt states he wants  30 tabs of sildenafil (VIAGRA) 20 MG tablets  He is going to pay out of pocket himself for this Rx Pt states he has done this in the past.  [id number] - [place], [place] [address] AT [address]

## 2021-04-23 NOTE — Progress Notes (Signed)
Date:  04/24/2021   Name:  Nicholas [name]   DOB:  Jan 15, 1966   MRN:  [id number]   Chief Complaint: Hypertension (Medicare yearly visit. )  Nicholas [name] is a 55 y.o. male who presents today for his Complete Annual Exam. He feels well. He reports exercising - twice a week. He reports he is sleeping fairly well.   Colonoscopy: 01/2018  Immunization History  Administered Date(s) Administered  . Moderna Sars-Covid-2 Vaccination 02/17/2020, 03/16/2020, 11/02/2020    Hypertension This is a chronic problem. The problem is controlled. Pertinent negatives include no chest pain, headaches, palpitations or shortness of breath. Past treatments include calcium channel blockers. The current treatment provides significant improvement.  Depression        This is a chronic problem.The problem is unchanged.  Associated symptoms include no fatigue, no appetite change, no myalgias and no headaches.  Past treatments include SNRIs - Serotonin and norepinephrine reuptake inhibitors and other medications.  Compliance with treatment is good. Heel/ankle pain - pain over the achilles tendon in the past month.  Worse when he first gets up but improves with walking.  Only on the right foot.  No trauma, no changes in activity.  He has not tried any anti-inflammatory medication.  Lab Results  Component Value Date   CREATININE 1.08 01/25/2020   BUN 6 01/25/2020   NA 142 01/25/2020   K 4.5 01/25/2020   CL 104 01/25/2020   CO2 24 01/25/2020   Lab Results  Component Value Date   CHOL 169 01/25/2020   HDL 49 01/25/2020   LDLCALC 103 (H) 01/25/2020   TRIG 94 01/25/2020   CHOLHDL 3.4 01/25/2020   Lab Results  Component Value Date   TSH 2.430 01/19/2018   No results found for: HGBA1C Lab Results  Component Value Date   WBC 7.2 01/25/2020   HGB 15.5 01/25/2020   HCT 45.4 01/25/2020   MCV 90 01/25/2020   PLT 287 01/25/2020   Lab Results  Component Value Date   ALT 20 01/25/2020   AST 37  01/25/2020   ALKPHOS 87 01/25/2020   BILITOT 1.3 (H) 01/25/2020     Review of Systems  Constitutional: Negative for appetite change, chills, diaphoresis, fatigue and unexpected weight change.  HENT: Negative for hearing loss, tinnitus, trouble swallowing and voice change.   Eyes: Negative for visual disturbance.  Respiratory: Negative for choking, shortness of breath and wheezing.   Cardiovascular: Negative for chest pain, palpitations and leg swelling.  Gastrointestinal: Negative for abdominal pain, blood in stool, constipation and diarrhea.  Genitourinary: Negative for difficulty urinating, dysuria and frequency.  Musculoskeletal: Positive for gait problem (right heel pain). Negative for arthralgias, back pain and myalgias.  Skin: Negative for color change and rash.  Neurological: Negative for dizziness, syncope and headaches.  Hematological: Negative for adenopathy.  Psychiatric/Behavioral: Positive for depression. Negative for dysphoric mood and sleep disturbance.    Patient Active Problem List   Diagnosis Date Noted  . Unspecified inflammatory spondylopathy, lumbar region ([place]) 12/13/2020  . Displacement of lumbar intervertebral disc without myelopathy 12/13/2020  . Mild hyperlipidemia 01/22/2019  . Leukocytosis 07/14/2017  . Chronic right shoulder pain 03/27/2017  . Severe episode of recurrent major depressive disorder, without psychotic features ([place]) 07/18/2016  . Generalized anxiety disorder 07/18/2016  . ED (erectile dysfunction) 06/17/2015  . Essential (primary) hypertension 06/17/2015  . Enlarged prostate 06/17/2015    No Known Allergies  Past Surgical History:  Procedure Laterality Date  . COLONOSCOPY WITH  PROPOFOL N/A 02/16/2018   Procedure: COLONOSCOPY WITH PROPOFOL;  Surgeon: Lucilla Lame, MD;  Location: [facility];  Service: Endoscopy;  Laterality: N/A;  . SHOULDER OPEN ROTATOR CUFF REPAIR Right 03/2016  . shoulder surgery Right     Social History    Tobacco Use  . Smoking status: Never Smoker  . Smokeless tobacco: Never Used  . Tobacco comment: smoking cessation materials not required  Vaping Use  . Vaping Use: Never used  Substance Use Topics  . Alcohol use: No  . Drug use: No     Medication list has been reviewed and updated.  Current Meds  Medication Sig  . amLODipine (NORVASC) 10 MG tablet Take 1 tablet (10 mg total) by mouth daily.  . cyclobenzaprine (FLEXERIL) 10 MG tablet Take 10 mg by mouth 3 (three) times daily as needed. Reported on 05/09/2016  . Omega-3 Fatty Acids (FISH OIL) 1000 MG CPDR Take by mouth.  . sildenafil (REVATIO) 20 MG tablet Take 1 tablet (20 mg total) by mouth daily as needed.  . sildenafil (VIAGRA) 100 MG tablet Take 0.5-1 tablets (50-100 mg total) by mouth daily as needed for erectile dysfunction.  . traZODone (DESYREL) 100 MG tablet TAKE 1 TABLET BY MOUTH EVERYDAY AT BEDTIME  . venlafaxine XR (EFFEXOR-XR) 150 MG 24 hr capsule TAKE 1 CAPSULE BY MOUTH EVERY DAY    PHQ 2/9 Scores 04/24/2021 12/13/2020 06/13/2020 01/25/2020  PHQ - 2 Score 0 0 0 0  PHQ- 9 Score 0 - 0 0    GAD 7 : Generalized Anxiety Score 04/24/2021 06/13/2020  Nervous, Anxious, on Edge 0 0  Control/stop worrying 0 0  Worry too much - different things 0 0  Trouble relaxing 0 0  Restless 0 0  Easily annoyed or irritable 0 0  Afraid - awful might happen 0 0  Total GAD 7 Score 0 0  Anxiety Difficulty Not difficult at all Not difficult at all    BP Readings from Last 3 Encounters:  04/24/21 128/70  07/11/20 122/78  07/07/20 123/88    Physical Exam Vitals and nursing note reviewed.  Constitutional:      Appearance: Normal appearance. He is well-developed.  HENT:     Head: Normocephalic.     Right Ear: Tympanic membrane, ear canal and external ear normal.     Left Ear: Tympanic membrane, ear canal and external ear normal.     Nose: Nose normal.  Eyes:     Conjunctiva/sclera: Conjunctivae normal.     Pupils: Pupils are  equal, round, and reactive to light.  Neck:     Thyroid: No thyromegaly.     Vascular: No carotid bruit.  Cardiovascular:     Rate and Rhythm: Normal rate and regular rhythm.     Heart sounds: Normal heart sounds.  Pulmonary:     Effort: Pulmonary effort is normal.     Breath sounds: Normal breath sounds. No wheezing.  Chest:  Breasts:     Right: No mass.     Left: No mass.    Abdominal:     General: Bowel sounds are normal.     Palpations: Abdomen is soft.     Tenderness: There is no abdominal tenderness.  Musculoskeletal:        General: Normal range of motion.     Cervical back: Normal range of motion and neck supple.     Right ankle: No swelling or deformity. Normal range of motion.     Right Achilles Tendon: Tenderness  present.     Right foot: No swelling, deformity or bony tenderness.     Left foot: No swelling, deformity or bony tenderness.  Lymphadenopathy:     Cervical: No cervical adenopathy.  Skin:    General: Skin is warm and dry.  Neurological:     Mental Status: He is alert and oriented to person, place, and time.     Deep Tendon Reflexes: Reflexes are normal and symmetric.  Psychiatric:        Attention and Perception: Attention normal.        Mood and Affect: Mood normal.        Thought Content: Thought content normal.     Wt Readings from Last 3 Encounters:  04/24/21 179 lb (81.2 kg)  07/11/20 168 lb (76.2 kg)  07/07/20 169 lb 1.5 oz (76.7 kg)    Blood Pressure 128/70   Pulse 82   Temperature (Abnormal) 97.5 F (36.4 C) (Oral)   Height 5\' 11"  (1.803 m)   Weight 179 lb (81.2 kg)   Oxygen Saturation 99%   Body Mass Index 24.97 kg/m   Assessment and Plan: 1. Annual physical exam Normal exam Continue healthy diet, exercise - HIV Antibody (routine testing w rflx)  2. Prostate cancer screening DRE deferred to lack of sx - PSA  3. Essential (primary) hypertension Clinically stable exam with well controlled BP on amlodipine. Tolerating  medications without side effects at this time. Pt to continue current regimen and low sodium diet; benefits of regular exercise as able discussed. - CBC with Differential/Platelet - Comprehensive metabolic panel - POCT urinalysis dipstick - negative today (has hx of hematuria)  4. Mild hyperlipidemia Low fat diet recommended. 10 yr risk moderately elevated 11% Will obtain labs and advise - Lipid panel  5. Severe episode of recurrent major depressive disorder, without psychotic features ([place]) Clinically stable on current regimen with good control of symptoms, No SI or HI. Will continue current therapy with Effexor and Trazodone  6. Disorder of right Achilles tendon Tylenol 1000 mg bid for a week. If no improvement, will refer to SM  7. Erectile dysfunction, unspecified erectile dysfunction type - sildenafil (REVATIO) 20 MG tablet; Take 1 tablet (20 mg total) by mouth daily as needed.  Dispense: 30 tablet; Refill: 5   Partially dictated using Editor, commissioning. Any errors are unintentional.  Halina Maidens, MD [facility]  04/24/2021

## 2021-04-24 ENCOUNTER — Encounter: Payer: Self-pay | Admitting: Internal Medicine

## 2021-04-24 ENCOUNTER — Ambulatory Visit (INDEPENDENT_AMBULATORY_CARE_PROVIDER_SITE_OTHER): Payer: Medicare (Managed Care) | Admitting: Internal Medicine

## 2021-04-24 ENCOUNTER — Other Ambulatory Visit: Payer: Self-pay

## 2021-04-24 VITALS — BP 128/70 | HR 82 | Temp 97.5°F | Ht 71.0 in | Wt 179.0 lb

## 2021-04-24 DIAGNOSIS — Z0000 Encounter for general adult medical examination without abnormal findings: Secondary | ICD-10-CM

## 2021-04-24 DIAGNOSIS — F332 Major depressive disorder, recurrent severe without psychotic features: Secondary | ICD-10-CM

## 2021-04-24 DIAGNOSIS — N529 Male erectile dysfunction, unspecified: Secondary | ICD-10-CM | POA: Diagnosis not present

## 2021-04-24 DIAGNOSIS — M67971 Unspecified disorder of synovium and tendon, right ankle and foot: Secondary | ICD-10-CM | POA: Diagnosis not present

## 2021-04-24 DIAGNOSIS — Z125 Encounter for screening for malignant neoplasm of prostate: Secondary | ICD-10-CM

## 2021-04-24 DIAGNOSIS — E785 Hyperlipidemia, unspecified: Secondary | ICD-10-CM

## 2021-04-24 DIAGNOSIS — I10 Essential (primary) hypertension: Secondary | ICD-10-CM | POA: Diagnosis not present

## 2021-04-24 LAB — POCT URINALYSIS DIPSTICK
Bilirubin, UA: NEGATIVE
Blood, UA: 5
Glucose, UA: NEGATIVE
Ketones, UA: NEGATIVE
Leukocytes, UA: NEGATIVE
Nitrite, UA: NEGATIVE
Protein, UA: NEGATIVE
Spec Grav, UA: 1.01 (ref 1.010–1.025)
Urobilinogen, UA: 0.2 E.U./dL
pH, UA: 5 (ref 5.0–8.0)

## 2021-04-24 MED ORDER — SILDENAFIL CITRATE 20 MG PO TABS: 20.0000 mg | ORAL_TABLET | Freq: Every day | ORAL | 5 refills | Status: DC | PRN

## 2021-04-24 NOTE — Patient Instructions (Signed)
Take Tylenol 1000 mg twice a day for a week to see how your heel feels. If no improvement, will refer to SM.

## 2021-04-25 ENCOUNTER — Other Ambulatory Visit: Payer: Self-pay | Admitting: Internal Medicine

## 2021-04-25 DIAGNOSIS — E785 Hyperlipidemia, unspecified: Secondary | ICD-10-CM

## 2021-04-25 LAB — CBC WITH DIFFERENTIAL/PLATELET
Basophils Absolute: 0.1 10*3/uL (ref 0.0–0.2)
Basos: 1 %
EOS (ABSOLUTE): 0.2 10*3/uL (ref 0.0–0.4)
Eos: 3 %
Hematocrit: 42.3 % (ref 37.5–51.0)
Hemoglobin: 14 g/dL (ref 13.0–17.7)
Immature Grans (Abs): 0 10*3/uL (ref 0.0–0.1)
Immature Granulocytes: 0 %
Lymphocytes Absolute: 1.9 10*3/uL (ref 0.7–3.1)
Lymphs: 28 %
MCH: 29.2 pg (ref 26.6–33.0)
MCHC: 33.1 g/dL (ref 31.5–35.7)
MCV: 88 fL (ref 79–97)
Monocytes Absolute: 0.5 10*3/uL (ref 0.1–0.9)
Monocytes: 7 %
Neutrophils Absolute: 4.2 10*3/uL (ref 1.4–7.0)
Neutrophils: 61 %
Platelets: 283 10*3/uL (ref 150–450)
RBC: 4.8 x10E6/uL (ref 4.14–5.80)
RDW: 11.8 % (ref 11.6–15.4)
WBC: 6.9 10*3/uL (ref 3.4–10.8)

## 2021-04-25 LAB — COMPREHENSIVE METABOLIC PANEL
ALT: 21 IU/L (ref 0–44)
AST: 27 IU/L (ref 0–40)
Albumin/Globulin Ratio: 1.8 (ref 1.2–2.2)
Albumin: 4.7 g/dL (ref 3.8–4.9)
Alkaline Phosphatase: 100 IU/L (ref 44–121)
BUN/Creatinine Ratio: 10 (ref 9–20)
BUN: 12 mg/dL (ref 6–24)
Bilirubin Total: 1 mg/dL (ref 0.0–1.2)
CO2: 21 mmol/L (ref 20–29)
Calcium: 9.4 mg/dL (ref 8.7–10.2)
Chloride: 99 mmol/L (ref 96–106)
Creatinine, Ser: 1.16 mg/dL (ref 0.76–1.27)
Globulin, Total: 2.6 g/dL (ref 1.5–4.5)
Glucose: 91 mg/dL (ref 65–99)
Potassium: 4.1 mmol/L (ref 3.5–5.2)
Sodium: 137 mmol/L (ref 134–144)
Total Protein: 7.3 g/dL (ref 6.0–8.5)
eGFR: 74 mL/min/{1.73_m2} (ref >59)

## 2021-04-25 LAB — LIPID PANEL
Chol/HDL Ratio: 4 ratio (ref 0.0–5.0)
Cholesterol, Total: 197 mg/dL (ref 100–199)
HDL: 49 mg/dL (ref >39)
LDL Chol Calc (NIH): 130 mg/dL — ABNORMAL HIGH (ref 0–99)
Triglycerides: 98 mg/dL (ref 0–149)
VLDL Cholesterol Cal: 18 mg/dL (ref 5–40)

## 2021-04-25 LAB — PSA: Prostate Specific Ag, Serum: 7.6 ng/mL — ABNORMAL HIGH (ref 0.0–4.0)

## 2021-04-25 LAB — HIV ANTIBODY (ROUTINE TESTING W REFLEX): HIV Screen 4th Generation wRfx: NONREACTIVE

## 2021-04-25 MED ORDER — ATORVASTATIN CALCIUM 10 MG PO TABS: 10.0000 mg | ORAL_TABLET | Freq: Every day | ORAL | 1 refills | Status: DC

## 2021-04-26 NOTE — Progress Notes (Signed)
04/27/2021 4:44 PM   Nicholas [name] 16-May-1966 [id number]  Referring provider: Glean Hess, MD [address] [place],  [place] [address]  Chief Complaint  Patient presents with  . Elevated PSA   Urological history: 1. BPH with LU TS -I PSS 1/0 -PVR 76 mL  2. ED -contributing factors of age, BPH, HTN, anxiety, depression, antidepressants and pain medication -SHIM 24 -managed with sildenafil 100 mg, on-demand-dosing  3. High risk hematuria -non-smoker -CTU and cysto NED 07/2019 -UA negative for micro heme   4. Elevated PSA  -PSA Trend Component     Latest Ref Rng & Units 05/09/2016 01/20/2019 06/08/2019 04/24/2021  Prostate Specific Ag, Serum     0.0 - 4.0 ng/mL 35.9 (H) 3.5 3.3 7.6 (H)   2.4 in 01/2013  35.9 in 05/2016  3.79 in 01/2018  3.5 in 01/2019  HPI: Nicholas [name] is a 55 y.o. male who presents today for elevated PSA.   When questioned, he believes he may have had sexual intercourse a couple days prior to his PSA draw.  He has no other urinary symptoms at this time.  Patient denies any modifying or aggravating factors.  Patient denies any gross hematuria, dysuria or suprapubic/flank pain.  Patient denies any fevers, chills, nausea or vomiting.   His UA is clear and his PVR is minimal.   IPSS    Row Name 04/27/21 1000         International Prostate Symptom Score   How often have you had the sensation of not emptying your bladder? Not at All     How often have you had to urinate less than every two hours? Not at All     How often have you found you stopped and started again several times when you urinated? Not at All     How often have you found it difficult to postpone urination? Not at All     How often have you had a weak urinary stream? Not at All     How often have you had to strain to start urination? Not at All     How many times did you typically get up at night to urinate? 1 Time     Total IPSS Score 1            Quality of Life due to urinary symptoms   If you were to spend the rest of your life with your urinary condition just the way it is now how would you feel about that? Delighted            Score:  1-7 Mild 8-19 Moderate 20-35 Severe   Patient still having spontaneous erections.   He denies any pain or curvature with erections.  He also denies any pain with ejaculation or discolored semen or blood in the semen.    SHIM    Row Name 04/27/21 1022         SHIM: Over the last 6 months:   How do you rate your confidence that you could get and keep an erection? High     When you had erections with sexual stimulation, how often were your erections hard enough for penetration (entering your partner)? Almost Always or Always     During sexual intercourse, how often were you able to maintain your erection after you had penetrated (entered) your partner? Almost Always or Always     During sexual intercourse, how difficult was it to maintain your  erection to completion of intercourse? Not Difficult     When you attempted sexual intercourse, how often was it satisfactory for you? Almost Always or Always           SHIM Total Score   SHIM 24            Score: 1-7 Severe ED 8-11 Moderate ED 12-16 Mild-Moderate ED 17-21 Mild ED 22-25 No ED     PMH: Past Medical History:  Diagnosis Date  . Anxiety   . Asthma   . BPH (benign prostatic hyperplasia)   . Depression   . ED (erectile dysfunction)   . HTN (hypertension)   . Low back pain   . Microscopic hematuria   . Nocturia   . Special screening for malignant neoplasms, colon   . Testicular pain, right 01/19/2018  . Urgency of micturation     Surgical History: Past Surgical History:  Procedure Laterality Date  . COLONOSCOPY WITH PROPOFOL N/A 02/16/2018   Procedure: COLONOSCOPY WITH PROPOFOL;  Surgeon: Lucilla Lame, MD;  Location: [facility];  Service: Endoscopy;  Laterality: N/A;  . SHOULDER OPEN ROTATOR CUFF REPAIR  Right 03/2016  . shoulder surgery Right     Home Medications:  Allergies as of 04/27/2021   No Known Allergies     Medication List       Accurate as of Apr 27, 2021 11:59 PM. If you have any questions, ask your nurse or doctor.        amLODipine 10 MG tablet Commonly known as: NORVASC Take 1 tablet (10 mg total) by mouth daily.   atorvastatin 10 MG tablet Commonly known as: LIPITOR Take 1 tablet (10 mg total) by mouth daily.   cyclobenzaprine 10 MG tablet Commonly known as: FLEXERIL Take 10 mg by mouth 3 (three) times daily as needed. Reported on 05/09/2016   Fish Oil 1000 MG Cpdr Take by mouth.   sildenafil 100 MG tablet Commonly known as: Viagra Take 0.5-1 tablets (50-100 mg total) by mouth daily as needed for erectile dysfunction.   sildenafil 20 MG tablet Commonly known as: REVATIO Take 1 tablet (20 mg total) by mouth daily as needed.   traZODone 100 MG tablet Commonly known as: DESYREL TAKE 1 TABLET BY MOUTH EVERYDAY AT BEDTIME   venlafaxine XR 150 MG 24 hr capsule Commonly known as: EFFEXOR-XR TAKE 1 CAPSULE BY MOUTH EVERY DAY       Allergies: No Known Allergies  Family History: Family History  Problem Relation Age of Onset  . CAD Mother   . COPD Mother   . Lung cancer Father   . Healthy Sister   . Kidney disease Neg Hx   . Prostate cancer Neg Hx     Social History:  reports that he has never smoked. He has never used smokeless tobacco. He reports that he does not drink alcohol and does not use drugs.  ROS: For pertinent review of systems please refer to history of present illness  Physical Exam: BP 136/87   Pulse 67   Ht 5\' 11"  (1.803 m)   Wt 179 lb (81.2 kg)   BMI 24.97 kg/m   Constitutional:  Well nourished. Alert and oriented, No acute distress. HEENT: [place] AT, mask in place.  Trachea midline Cardiovascular: No clubbing, cyanosis, or edema. Respiratory: Normal respiratory effort, no increased work of breathing. GU: No CVA tenderness.   No bladder fullness or masses.  Patient with circumcised phallus.  Urethral meatus is patent.  No penile discharge.  No penile lesions or rashes. Scrotum without lesions, cysts, rashes and/or edema.  Testicles are located scrotally bilaterally. No masses are appreciated in the testicles. Left and right epididymis are normal. Rectal: Patient with  normal sphincter tone. Anus and perineum without scarring or rashes. No rectal masses are appreciated. Prostate is approximately 50 grams, right lobe>left lobe, no nodules are appreciated. Seminal vesicles could not be palpated Lymph: No inguinal adenopathy. Neurologic: Grossly intact, no focal deficits, moving all 4 extremities. Psychiatric: Normal mood and affect.   Laboratory Data: PSA History  2.4 in 01/2013  35.9 in 05/2016  3.79 in 01/2018  3.5 in 01/2019    Lab Results  Component Value Date   WBC 6.9 04/24/2021   HGB 14.0 04/24/2021   HCT 42.3 04/24/2021   MCV 88 04/24/2021   PLT 283 04/24/2021    Lab Results  Component Value Date   CREATININE 1.16 04/24/2021    Lab Results  Component Value Date   TSH 2.430 01/19/2018       Component Value Date/Time   CHOL 197 04/24/2021 1125   HDL 49 04/24/2021 1125   CHOLHDL 4.0 04/24/2021 1125   LDLCALC 130 (H) 04/24/2021 1125    Results for orders placed or performed in visit on 04/27/21  CULTURE, URINE COMPREHENSIVE   Specimen: Urine   UR  Result Value Ref Range   Urine Culture, Comprehensive Final report    Organism ID, Bacteria Comment   Microscopic Examination   Urine  Result Value Ref Range   WBC, UA 0-5 0 - 5 /hpf   RBC 0-2 0 - 2 /hpf   Epithelial Cells (non renal) 0-10 0 - 10 /hpf   Mucus, UA Present Not Estab.   Bacteria, UA None seen None seen/Few  Urinalysis, Complete  Result Value Ref Range   Specific Gravity, UA 1.025 1.005 - 1.030   pH, UA 5.5 5.0 - 7.5   Color, UA Yellow Yellow   Appearance Ur Hazy (A) Clear   Leukocytes,UA Negative Negative   Protein,UA  Negative Negative/Trace   Glucose, UA Negative Negative   Ketones, UA Negative Negative   RBC, UA Negative Negative   Bilirubin, UA Negative Negative   Urobilinogen, Ur 0.2 0.2 - 1.0 mg/dL   Nitrite, UA Negative Negative   Microscopic Examination See below:   Bladder Scan (Post Void Residual) in office  Result Value Ref Range   Scan Result 76    Urinalysis  Component     Latest Ref Rng & Units 04/27/2021  Specific Gravity, UA     1.005 - 1.030 1.025  pH, UA     5.0 - 7.5 5.5  Color, UA     Yellow Yellow  Appearance Ur     Clear Hazy (A)  Leukocytes,UA     Negative Negative  Protein,UA     Negative/Trace Negative  Glucose, UA     Negative Negative  Ketones, UA     Negative Negative  RBC, UA     Negative Negative  Bilirubin, UA     Negative Negative  Urobilinogen, Ur     0.2 - 1.0 mg/dL 0.2  Nitrite, UA     Negative Negative  Microscopic Examination      See below:   Component     Latest Ref Rng & Units 04/27/2021  WBC, UA     0 - 5 /hpf 0-5  RBC     0 - 2 /hpf 0-2  Epithelial Cells (non renal)  0 - 10 /hpf 0-10  Mucus, UA     Not Estab. Present  Bacteria, UA     None seen/Few None seen  I have reviewed the labs.  Assessment & Plan:    1. Elevated PSA -explained to the patient that the asymmetry in his prostate may be a benign finding or may represent early stages of prostate cancer in association with the elevated PSA.  I also explained that having ejaculation just prior to having blood drawn for PSA may have falsely elevated his levels.  At this time I recommend that we repeat the PSA in 1 month to see if it remains elevated.  If it remains elevated, I recommend he undergo a prostate biopsy.  I explained how a prostate biopsy was performed in our office and the risks involved, such as urinary retention, acute prostatitis and/or sepsis.  I also explained that he should expect having blood in the urine, blood in the stool and blood in the ejaculate fluid  intermittently after the biopsy.  He also queried if other test could be done and I explained to him there are 4K scores available as well as a prostate MRI that can further stratify his risk for a high-grade prostate cancer, but due to his young age I would recommend undergoing the prostate biopsy if his PSA should return elevated.  2. Erectile dysfunction -continue sildenafil as prescribed by Dr. Army Melia  3. BPH with LUTS -continue conservative management, avoiding bladder irritants and timed voiding's  4. High risk hematuria -No reports of gross hematuria -UA is negative for micro heme  Return in about 1 month (around 05/28/2021) for PSA only .  These notes generated with voice recognition software. I apologize for typographical errors.  Zara Council, PA-C  [facility] [address] [place], [place] [address] [phone]

## 2021-04-27 ENCOUNTER — Encounter: Payer: Self-pay | Admitting: Urology

## 2021-04-27 ENCOUNTER — Ambulatory Visit (INDEPENDENT_AMBULATORY_CARE_PROVIDER_SITE_OTHER): Payer: Medicare (Managed Care) | Admitting: Urology

## 2021-04-27 ENCOUNTER — Other Ambulatory Visit: Payer: Self-pay

## 2021-04-27 VITALS — BP 136/87 | HR 67 | Ht 71.0 in | Wt 179.0 lb

## 2021-04-27 DIAGNOSIS — N138 Other obstructive and reflux uropathy: Secondary | ICD-10-CM

## 2021-04-27 DIAGNOSIS — N401 Enlarged prostate with lower urinary tract symptoms: Secondary | ICD-10-CM

## 2021-04-27 DIAGNOSIS — R319 Hematuria, unspecified: Secondary | ICD-10-CM

## 2021-04-27 DIAGNOSIS — R972 Elevated prostate specific antigen [PSA]: Secondary | ICD-10-CM

## 2021-04-27 LAB — URINALYSIS, COMPLETE
Bilirubin, UA: NEGATIVE
Glucose, UA: NEGATIVE
Ketones, UA: NEGATIVE
Leukocytes,UA: NEGATIVE
Nitrite, UA: NEGATIVE
Protein,UA: NEGATIVE
RBC, UA: NEGATIVE
Specific Gravity, UA: 1.025 (ref 1.005–1.030)
Urobilinogen, Ur: 0.2 mg/dL (ref 0.2–1.0)
pH, UA: 5.5 (ref 5.0–7.5)

## 2021-04-27 LAB — MICROSCOPIC EXAMINATION: Bacteria, UA: NONE SEEN

## 2021-04-27 LAB — BLADDER SCAN AMB NON-IMAGING: Scan Result: 76

## 2021-04-30 LAB — CULTURE, URINE COMPREHENSIVE

## 2021-05-15 ENCOUNTER — Other Ambulatory Visit: Payer: Self-pay | Admitting: Internal Medicine

## 2021-05-28 ENCOUNTER — Other Ambulatory Visit: Payer: Self-pay

## 2021-05-28 ENCOUNTER — Other Ambulatory Visit: Payer: Medicare (Managed Care)

## 2021-05-28 DIAGNOSIS — R972 Elevated prostate specific antigen [PSA]: Secondary | ICD-10-CM

## 2021-05-29 LAB — PSA: Prostate Specific Ag, Serum: 7.7 ng/mL — ABNORMAL HIGH (ref 0.0–4.0)

## 2021-05-31 NOTE — Progress Notes (Addendum)
Virtual Visit via Telephone Note  I connected with [name] on 05/31/21 at  8:30 AM EDT by telephone and verified that I am speaking with the correct person using two identifiers.  Location: Patient: House  Provider: Office    I discussed the limitations, risks, security and privacy concerns of performing an evaluation and management service by telephone and the availability of in person appointments. I also discussed with the patient that there may be a patient responsible charge related to this service. The patient expressed understanding and agreed to proceed.   History of Present Illness: Urological history: 1. BPH with LU TS -I PSS 1/0 -PVR 76 mL   2. ED -contributing factors of age, BPH, HTN, anxiety, depression, antidepressants and pain medication -SHIM 24 -managed with sildenafil 100 mg, on-demand-dosing   3. High risk hematuria -non-smoker -CTU and cysto NED 07/2019 -UA negative for micro heme    4. Elevated PSA -PSA Trend Component     Latest Ref Rng & Units 05/09/2016  Prostate Specific Ag, Serum     0.0 - 4.0 ng/mL 35.9 (H)              2.4 in 01/2013             35.9 in 05/2016             3.79 in 01/2018             3.5 in 01/2019 Component     Latest Ref Rng & Units 01/20/2019 06/08/2019 04/24/2021 05/28/2021  Prostate Specific Ag, Serum     0.0 - 4.0 ng/mL 3.5 3.3 7.6 (H) 7.7 (H)      Observations/Objective: He is answering questions appropriately and does not sound distressed  Assessment and Plan: Elevated PSA -discussed that although PSA is a prostate cancer screening test he was informed that cancer is not the most common cause of an elevated PSA. Other potential causes including BPH and inflammation were discussed. He was informed that the only way to adequately diagnose prostate cancer would be a transrectal ultrasound and biopsy of the prostate. The procedure was discussed including potential risks of bleeding and infection/sepsis. He was also  informed that a negative biopsy does not conclusively rule out the possibility that prostate cancer may be present and that continued monitoring is required. The use of newer adjunctive blood tests including 4k Score was discussed. The use of multiparametric prostate MRI was also discussed.  -He admitted that he had sexual intercourse on the Saturday prior to his blood drawn on June 27    Follow Up Instructions:  Instructed patient to abstain from all sexual activity and ejaculations and to return to the office on June 22, 2021 for repeat PSA.  If his PSA remains elevated, I have recommended that he undergo prostate biopsy.    I discussed the assessment and treatment plan with the patient. The patient was provided an opportunity to ask questions and all were answered. The patient agreed with the plan and demonstrated an understanding of the instructions.   The patient was advised to call back or seek an in-person evaluation if the symptoms worsen or if the condition fails to improve as anticipated.  I provided 30 minutes of non-face-to-face time during this encounter.   Monteen Toops, PA-C

## 2021-06-01 ENCOUNTER — Other Ambulatory Visit: Payer: Self-pay

## 2021-06-01 ENCOUNTER — Telehealth (INDEPENDENT_AMBULATORY_CARE_PROVIDER_SITE_OTHER): Payer: Medicare (Managed Care) | Admitting: Urology

## 2021-06-01 DIAGNOSIS — R972 Elevated prostate specific antigen [PSA]: Secondary | ICD-10-CM | POA: Diagnosis not present

## 2021-06-01 NOTE — Progress Notes (Signed)
This service is provided via telemedicine   No vital signs collected/recorded due to the encounter was a telemedicine visit.     Patient consents to a telephone visit:  Patient consents-reviewed all medications, pharmacy, allergies, history    Names of all persons participating in the telemedicine service and their role in the encounter:  Verlene Mayer, [place]

## 2021-06-09 ENCOUNTER — Other Ambulatory Visit: Payer: Self-pay | Admitting: Internal Medicine

## 2021-06-09 NOTE — Telephone Encounter (Signed)
Approved per protocol. Providing 90 days supply with 2 refills. Requested Prescriptions  Pending Prescriptions Disp Refills  . venlafaxine XR (EFFEXOR-XR) 150 MG 24 hr capsule [Pharmacy Med Name: VENLAFAXINE HCL ER 150 MG CAP] 90 capsule 2    Sig: TAKE 1 CAPSULE BY MOUTH EVERY DAY     Psychiatry: Antidepressants - SNRI - desvenlafaxine & venlafaxine Failed - 06/09/2021 10:35 AM      Failed - LDL in normal range and within 360 days    LDL Chol Calc (NIH)  Date Value Ref Range Status  04/24/2021 130 (H) 0 - 99 mg/dL Final         Passed - Total Cholesterol in normal range and within 360 days    Cholesterol, Total  Date Value Ref Range Status  04/24/2021 197 100 - 199 mg/dL Final         Passed - Triglycerides in normal range and within 360 days    Triglycerides  Date Value Ref Range Status  04/24/2021 98 0 - 149 mg/dL Final         Passed - Completed PHQ-2 or PHQ-9 in the last 360 days      Passed - Last BP in normal range    BP Readings from Last 1 Encounters:  04/27/21 136/87         Passed - Valid encounter within last 6 months    Recent Outpatient Visits          1 month ago Annual physical exam   [facility] Glean Hess, MD   11 months ago Hordeolum internum of left upper eyelid   [facility] Glean Hess, MD   12 months ago Tinea cruris   [facility] Glean Hess, MD   1 year ago Essential (primary) hypertension   [facility] Glean Hess, MD   2 years ago Tinea corporis   [facility] Glean Hess, MD      Future Appointments            In 4 months Army Melia Jesse Sans, MD [facility], [place]   In 10 months Army Melia Jesse Sans, MD [facility], [facility]

## 2021-06-14 ENCOUNTER — Other Ambulatory Visit: Payer: Self-pay | Admitting: Internal Medicine

## 2021-06-14 DIAGNOSIS — I10 Essential (primary) hypertension: Secondary | ICD-10-CM

## 2021-06-22 ENCOUNTER — Other Ambulatory Visit: Payer: Self-pay

## 2021-06-22 ENCOUNTER — Other Ambulatory Visit: Payer: Medicare (Managed Care)

## 2021-06-22 DIAGNOSIS — R972 Elevated prostate specific antigen [PSA]: Secondary | ICD-10-CM

## 2021-06-23 LAB — PSA: Prostate Specific Ag, Serum: 6.6 ng/mL — ABNORMAL HIGH (ref 0.0–4.0)

## 2021-06-26 ENCOUNTER — Encounter: Payer: Self-pay | Admitting: *Deleted

## 2021-07-29 NOTE — Progress Notes (Incomplete)
    07/31/2021  CC: No chief complaint on file.   HPI: Nicholas [name] is a 55 y.o. male who returns today for biopsy.   He has a history of elevated PSA in 2017 his PSA was as high as 35.9 his most recent PSA on 06/22/2021 went down to 6.6.    He has a history of BPH with LUTS that is managed conservatively. He also has ED that is managed with sildenafil prescribed by Dr. Army Melia.   PSA trend  Component     Latest Ref Rng & Units 01/20/2019 06/08/2019 04/24/2021 05/28/2021  Prostate Specific Ag, Serum     0.0 - 4.0 ng/mL 3.5 3.3 7.6 (H) 7.7 (H)   Component     Latest Ref Rng & Units 06/22/2021  Prostate Specific Ag, Serum     0.0 - 4.0 ng/mL 6.6 (H)    There were no vitals filed for this visit. NED. A&Ox3.   No respiratory distress   Abd soft, NT, ND Normal phallus with bilateral descended testicles   Prostate Biopsy Procedure   Informed consent was obtained after discussing risks/benefits of the procedure.  A time out was performed to ensure correct patient identity.  Pre-Procedure: - Last PSA Level:  Lab Results  Component Value Date   PSA 2.5 02/15/2013   - Gentamicin given prophylactically - Levaquin 500 mg administered PO -Transrectal Ultrasound performed revealing a *** gm prostate -No significant hypoechoic or median lobe noted  Procedure: - Prostate block performed using 10 cc 1% lidocaine and biopsies taken from sextant areas, a total of 12 under ultrasound guidance.  Post-Procedure: - Patient tolerated the procedure well - He was counseled to seek immediate medical attention if experiences any severe pain, significant bleeding, or fevers - Return in one week to discuss biopsy results

## 2021-07-31 ENCOUNTER — Other Ambulatory Visit: Payer: Self-pay

## 2021-07-31 ENCOUNTER — Other Ambulatory Visit: Payer: Medicare (Managed Care) | Admitting: Urology

## 2021-07-31 ENCOUNTER — Telehealth: Payer: Self-pay

## 2021-07-31 NOTE — Telephone Encounter (Signed)
Please review.  KP

## 2021-07-31 NOTE — Telephone Encounter (Signed)
Copied from [place]. Topic: General - Inquiry >> Jul 31, 2021 10:25 AM Robina Ade, Helene Kelp D wrote: Reason for CRM: Patient would like to speak to Dr. Army Melia about his visit today for insurance purpose. Please call patient back, thanks.

## 2021-07-31 NOTE — Telephone Encounter (Signed)
Reached out to Parke Poisson in our referrals team to set pt up with another Urologist that does accept his insurance.  KP

## 2021-07-31 NOTE — Telephone Encounter (Signed)
Parke Poisson told me to reach out to [facility]. Reached out to her waiting on a response.  KP

## 2021-08-07 ENCOUNTER — Ambulatory Visit: Payer: Medicare (Managed Care) | Admitting: Urology

## 2021-08-09 ENCOUNTER — Other Ambulatory Visit: Payer: Self-pay

## 2021-08-09 ENCOUNTER — Telehealth: Payer: Self-pay

## 2021-08-09 DIAGNOSIS — Z125 Encounter for screening for malignant neoplasm of prostate: Secondary | ICD-10-CM

## 2021-08-09 DIAGNOSIS — C61 Malignant neoplasm of prostate: Secondary | ICD-10-CM

## 2021-08-09 NOTE — Telephone Encounter (Signed)
Referral was placed.  KP

## 2021-08-09 NOTE — Telephone Encounter (Signed)
Copied from [place] [id number]. Topic: General - Other >> Aug 09, 2021  9:48 AM Valere Dross wrote: Reason for CRM: Pt called in about his referral stating they don't accept his insurance and wanted to see if PCP could send another referral to another place, please advise.

## 2021-08-10 NOTE — Telephone Encounter (Signed)
Nicholas [name] from [facility] called states that referral was sent to them again but they dont accept patientsin insurance. Please call back to give referral that takes patients insurance

## 2021-09-25 ENCOUNTER — Telehealth: Payer: Self-pay | Admitting: Family Medicine

## 2021-09-25 NOTE — Telephone Encounter (Signed)
[employer] called with patient on 3 way. The [facility] lady said she wants to have [facility] as his Dealer. I informed the patient and her that [facility] is not in network with our office. Patient states he has looked for another Urologist that will except his insurance and there is not any in our area. The insurance company states they will honor her as his Dealer. The [facility] lady hung up and left just the patient and I on the phone. I informed him that he is welcome to come in to see Larene [place] but we are not responsible for the bill. If the insurance company decides not to pay for the care it is the patient's responsibility. Patient voiced understanding.

## 2021-09-27 NOTE — Telephone Encounter (Signed)
Spoke with patient and explained that we do not except his insurance and his past claims with [facility] have not been paid. He called and they gave him [facility] as a provider that does accept his insurance and he asked to be referred there. I have faxed his records over today.  Sharyn Lull

## 2021-10-24 ENCOUNTER — Other Ambulatory Visit: Payer: Self-pay | Admitting: Internal Medicine

## 2021-10-24 DIAGNOSIS — E785 Hyperlipidemia, unspecified: Secondary | ICD-10-CM

## 2021-10-24 DIAGNOSIS — N529 Male erectile dysfunction, unspecified: Secondary | ICD-10-CM

## 2021-10-24 NOTE — Telephone Encounter (Signed)
Requested Prescriptions  Pending Prescriptions Disp Refills  . atorvastatin (LIPITOR) 10 MG tablet [Pharmacy Med Name: ATORVASTATIN 10 MG TABLET] 90 tablet 0    Sig: TAKE 1 TABLET BY MOUTH EVERY DAY     Cardiovascular:  Antilipid - Statins Failed - 10/24/2021  1:54 AM      Failed - LDL in normal range and within 360 days    LDL Chol Calc (NIH)  Date Value Ref Range Status  04/24/2021 130 (H) 0 - 99 mg/dL Final         Passed - Total Cholesterol in normal range and within 360 days    Cholesterol, Total  Date Value Ref Range Status  04/24/2021 197 100 - 199 mg/dL Final         Passed - HDL in normal range and within 360 days    HDL  Date Value Ref Range Status  04/24/2021 49 >39 mg/dL Final         Passed - Triglycerides in normal range and within 360 days    Triglycerides  Date Value Ref Range Status  04/24/2021 98 0 - 149 mg/dL Final         Passed - Patient is not pregnant      Passed - Valid encounter within last 12 months    Recent Outpatient Visits          6 months ago Annual physical exam   [facility] Glean Hess, MD   1 year ago Hordeolum internum of left upper eyelid   [facility] Glean Hess, MD   1 year ago Tinea cruris   [facility] Glean Hess, MD   1 year ago Essential (primary) hypertension   [facility] Glean Hess, MD   2 years ago Tinea corporis   [facility] Glean Hess, MD      Future Appointments            In 6 days Glean Hess, MD [facility], [place]   In 6 months Army Melia, Jesse Sans, MD [facility], [facility]

## 2021-10-24 NOTE — Telephone Encounter (Signed)
Requested Prescriptions  Pending Prescriptions Disp Refills  . sildenafil (REVATIO) 20 MG tablet [Pharmacy Med Name: SILDENAFIL 20MG  TABLETS] 30 tablet 2    Sig: TAKE 1 TABLET(20 MG) BY MOUTH DAILY AS NEEDED     Urology: Erectile Dysfunction Agents Passed - 10/24/2021  8:39 AM      Passed - Last BP in normal range    BP Readings from Last 1 Encounters:  04/27/21 136/87         Passed - Valid encounter within last 12 months    Recent Outpatient Visits          6 months ago Annual physical exam   [facility] Glean Hess, MD   1 year ago Hordeolum internum of left upper eyelid   [facility] Glean Hess, MD   1 year ago Tinea cruris   [facility] Glean Hess, MD   1 year ago Essential (primary) hypertension   [facility] Glean Hess, MD   2 years ago Tinea corporis   [facility] Glean Hess, MD      Future Appointments            In 6 days Glean Hess, MD [facility], [place]   In 6 months Army Melia, Jesse Sans, MD [facility], [facility]

## 2021-10-30 ENCOUNTER — Ambulatory Visit (INDEPENDENT_AMBULATORY_CARE_PROVIDER_SITE_OTHER): Payer: Medicare (Managed Care) | Admitting: Internal Medicine

## 2021-10-30 ENCOUNTER — Encounter: Payer: Self-pay | Admitting: Internal Medicine

## 2021-10-30 ENCOUNTER — Other Ambulatory Visit: Payer: Self-pay

## 2021-10-30 VITALS — BP 122/70 | HR 86 | Ht 71.0 in | Wt 175.0 lb

## 2021-10-30 DIAGNOSIS — F324 Major depressive disorder, single episode, in partial remission: Secondary | ICD-10-CM | POA: Diagnosis not present

## 2021-10-30 DIAGNOSIS — N400 Enlarged prostate without lower urinary tract symptoms: Secondary | ICD-10-CM

## 2021-10-30 DIAGNOSIS — I10 Essential (primary) hypertension: Secondary | ICD-10-CM | POA: Diagnosis not present

## 2021-10-30 DIAGNOSIS — M4696 Unspecified inflammatory spondylopathy, lumbar region: Secondary | ICD-10-CM

## 2021-10-30 NOTE — Progress Notes (Signed)
Date:  10/30/2021   Name:  Nicholas [name]   DOB:  1966-01-01   MRN:  [id number]   Chief Complaint: Hypertension  Hypertension This is a chronic problem. The problem is controlled. Pertinent negatives include no chest pain, headaches, palpitations or shortness of breath. Past treatments include calcium channel blockers. The current treatment provides significant improvement. There are no compliance problems.  There is no history of kidney disease, CAD/MI or CVA.  Prostate enlargement - seen by Urology and bx recommended.  However he was told that they did not accept [facility] on the day of the bx.  He now has an appt with [facility] in December.  Lab Results  Component Value Date   NA 137 04/24/2021   K 4.1 04/24/2021   CO2 21 04/24/2021   GLUCOSE 91 04/24/2021   BUN 12 04/24/2021   CREATININE 1.16 04/24/2021   CALCIUM 9.4 04/24/2021   EGFR 74 04/24/2021   GFRNONAA 77 01/25/2020   Lab Results  Component Value Date   CHOL 197 04/24/2021   HDL 49 04/24/2021   LDLCALC 130 (H) 04/24/2021   TRIG 98 04/24/2021   CHOLHDL 4.0 04/24/2021   Lab Results  Component Value Date   TSH 2.430 01/19/2018   No results found for: HGBA1C Lab Results  Component Value Date   WBC 6.9 04/24/2021   HGB 14.0 04/24/2021   HCT 42.3 04/24/2021   MCV 88 04/24/2021   PLT 283 04/24/2021   Lab Results  Component Value Date   ALT 21 04/24/2021   AST 27 04/24/2021   ALKPHOS 100 04/24/2021   BILITOT 1.0 04/24/2021   No results found for: 25OHVITD2, 25OHVITD3, VD25OH   Review of Systems  Constitutional:  Negative for fatigue and unexpected weight change.  HENT:  Negative for nosebleeds.   Eyes:  Negative for visual disturbance.  Respiratory:  Negative for cough, chest tightness, shortness of breath and wheezing.   Cardiovascular:  Negative for chest pain, palpitations and leg swelling.  Gastrointestinal:  Negative for abdominal pain, constipation and diarrhea.  Neurological:  Negative for  dizziness, weakness, light-headedness and headaches.   Patient Active Problem List   Diagnosis Date Noted   Unspecified inflammatory spondylopathy, lumbar region ([place]) 12/13/2020   Displacement of lumbar intervertebral disc without myelopathy 12/13/2020   Mild hyperlipidemia 01/22/2019   Leukocytosis 07/14/2017   Chronic right shoulder pain 03/27/2017   Severe episode of recurrent major depressive disorder, without psychotic features ([place]) 07/18/2016   Generalized anxiety disorder 07/18/2016   ED (erectile dysfunction) 06/17/2015   Essential (primary) hypertension 06/17/2015   Enlarged prostate 06/17/2015    No Known Allergies  Past Surgical History:  Procedure Laterality Date   COLONOSCOPY WITH PROPOFOL N/A 02/16/2018   Procedure: COLONOSCOPY WITH PROPOFOL;  Surgeon: Lucilla Lame, MD;  Location: [place];  Service: Endoscopy;  Laterality: N/A;   SHOULDER OPEN ROTATOR CUFF REPAIR Right 03/2016   shoulder surgery Right     Social History   Tobacco Use   Smoking status: Never   Smokeless tobacco: Never   Tobacco comments:    smoking cessation materials not required  Vaping Use   Vaping Use: Never used  Substance Use Topics   Alcohol use: No   Drug use: No     Medication list has been reviewed and updated.  Current Meds  Medication Sig   amLODipine (NORVASC) 10 MG tablet TAKE 1 TABLET BY MOUTH EVERY DAY   atorvastatin (LIPITOR) 10 MG tablet TAKE 1 TABLET  BY MOUTH EVERY DAY   cyclobenzaprine (FLEXERIL) 10 MG tablet Take 1 tablet by mouth 2 (two) times daily as needed.   etodolac (LODINE) 400 MG tablet Take 1 tablet by mouth in the morning and at bedtime.   Omega-3 Fatty Acids (FISH OIL) 1000 MG CPDR Take by mouth.   sildenafil (REVATIO) 20 MG tablet TAKE 1 TABLET(20 MG) BY MOUTH DAILY AS NEEDED   traZODone (DESYREL) 100 MG tablet TAKE 1 TABLET BY MOUTH EVERYDAY AT BEDTIME (Patient taking differently: as needed.)   venlafaxine XR (EFFEXOR-XR) 150 MG 24 hr  capsule TAKE 1 CAPSULE BY MOUTH EVERY DAY (Patient taking differently: as needed.)   [DISCONTINUED] sildenafil (VIAGRA) 100 MG tablet Take 0.5-1 tablets (50-100 mg total) by mouth daily as needed for erectile dysfunction.    PHQ 2/9 Scores 10/30/2021 04/24/2021 12/13/2020 06/13/2020  PHQ - 2 Score 4 0 0 0  PHQ- 9 Score 12 0 - 0    GAD 7 : Generalized Anxiety Score 10/30/2021 04/24/2021 06/13/2020  Nervous, Anxious, on Edge 2 0 0  Control/stop worrying 2 0 0  Worry too much - different things 2 0 0  Trouble relaxing 2 0 0  Restless 1 0 0  Easily annoyed or irritable 1 0 0  Afraid - awful might happen 1 0 0  Total GAD 7 Score 11 0 0  Anxiety Difficulty Not difficult at all Not difficult at all Not difficult at all    BP Readings from Last 3 Encounters:  10/30/21 122/70  04/27/21 136/87  04/24/21 128/70    Physical Exam Vitals and nursing note reviewed.  Constitutional:      General: He is not in acute distress.    Appearance: He is well-developed.  HENT:     Head: Normocephalic and atraumatic.  Pulmonary:     Effort: Pulmonary effort is normal. No respiratory distress.  Skin:    General: Skin is warm and dry.     Findings: No rash.  Neurological:     Mental Status: He is alert and oriented to person, place, and time.  Psychiatric:        Mood and Affect: Mood normal.        Behavior: Behavior normal.    Wt Readings from Last 3 Encounters:  10/30/21 175 lb (79.4 kg)  04/27/21 179 lb (81.2 kg)  04/24/21 179 lb (81.2 kg)    BP 122/70   Pulse 86   Ht 5' 11"  (1.803 m)   Wt 175 lb (79.4 kg)   SpO2 96%   BMI 24.41 kg/m   Assessment and Plan: 1. Essential (primary) hypertension Clinically stable exam with well controlled BP. Tolerating medications without side effects at this time. Pt to continue current regimen and low sodium diet; benefits of regular exercise as able discussed.  2. Enlarged prostate Seeing [facility] next month for consideration of biopsy  3.  Unspecified inflammatory spondylopathy, lumbar region ([place]) Chronic unchanged - treated with NSAIDS  4. Major depression single episode, in partial remission ([place]) He is now taking Effexor only as needed - feels that he is doing fairly well at this time.   Partially dictated using Editor, commissioning. Any errors are unintentional.  Halina Maidens, MD [facility]  10/30/2021

## 2021-11-01 ENCOUNTER — Other Ambulatory Visit: Payer: Self-pay | Admitting: Internal Medicine

## 2021-11-01 DIAGNOSIS — E785 Hyperlipidemia, unspecified: Secondary | ICD-10-CM

## 2021-11-03 ENCOUNTER — Other Ambulatory Visit: Payer: Self-pay | Admitting: Internal Medicine

## 2021-11-03 NOTE — Telephone Encounter (Signed)
prescription was signed by Dr Army Melia 10/24/21

## 2021-11-04 NOTE — Telephone Encounter (Signed)
Called [facility] and spoke with Raquel Sarna (pharmacist) Discussed refill request and he still had 1 RF left. Raquel Sarna stated she will remove the request for refill.  Requested Prescriptions  Pending Prescriptions Disp Refills   venlafaxine XR (EFFEXOR-XR) 150 MG 24 hr capsule [Pharmacy Med Name: VENLAFAXINE HCL ER 150 MG CAP] 90 capsule 2    Sig: TAKE 1 CAPSULE BY MOUTH EVERY DAY     Psychiatry: Antidepressants - SNRI - desvenlafaxine & venlafaxine Failed - 11/03/2021 11:30 AM      Failed - LDL in normal range and within 360 days    LDL Chol Calc (NIH)  Date Value Ref Range Status  04/24/2021 130 (H) 0 - 99 mg/dL Final          Passed - Total Cholesterol in normal range and within 360 days    Cholesterol, Total  Date Value Ref Range Status  04/24/2021 197 100 - 199 mg/dL Final          Passed - Triglycerides in normal range and within 360 days    Triglycerides  Date Value Ref Range Status  04/24/2021 98 0 - 149 mg/dL Final          Passed - Completed PHQ-2 or PHQ-9 in the last 360 days      Passed - Last BP in normal range    BP Readings from Last 1 Encounters:  10/30/21 122/70          Passed - Valid encounter within last 6 months    Recent Outpatient Visits           5 days ago Essential (primary) hypertension   [facility] Glean Hess, MD   6 months ago Annual physical exam   [facility] Glean Hess, MD   1 year ago Hordeolum internum of left upper eyelid   [facility] Glean Hess, MD   1 year ago Tinea cruris   [facility] Glean Hess, MD   1 year ago Essential (primary) hypertension   [facility] Glean Hess, MD       Future Appointments             In 5 months Army Melia Jesse Sans, MD [facility], [facility]

## 2021-12-06 DIAGNOSIS — H0014 Chalazion left upper eyelid: Secondary | ICD-10-CM | POA: Diagnosis not present

## 2021-12-19 ENCOUNTER — Ambulatory Visit: Payer: Medicare (Managed Care)

## 2021-12-24 ENCOUNTER — Ambulatory Visit (INDEPENDENT_AMBULATORY_CARE_PROVIDER_SITE_OTHER): Payer: Medicare (Managed Care)

## 2021-12-24 DIAGNOSIS — Z0000 Encounter for general adult medical examination without abnormal findings: Secondary | ICD-10-CM | POA: Diagnosis not present

## 2021-12-24 NOTE — Progress Notes (Signed)
Subjective:   Nicholas [name] is a 56 y.o. male who presents for Medicare Annual/Subsequent preventive examination.  Virtual Visit via Telephone Note  I connected with  Nicholas [name] on 12/24/21 at  2:00 PM EST by telephone and verified that I am speaking with the correct person using two identifiers.  Location: Patient: home  Provider: [facility] Persons participating in the virtual visit: [place]   I discussed the limitations, risks, security and privacy concerns of performing an evaluation and management service by telephone and the availability of in person appointments. The patient expressed understanding and agreed to proceed.  Interactive audio and video telecommunications were attempted between this nurse and patient, however failed, due to patient having technical difficulties OR patient did not have access to video capability.  We continued and completed visit with audio only.  Some vital signs may be absent or patient reported.   [name], LPN   Review of Systems     Cardiac Risk Factors include: advanced age (>39men, >58 women);male gender;dyslipidemia;hypertension     Objective:    Today's Vitals   12/24/21 1251  PainSc: 6    There is no height or weight on file to calculate BMI.  Advanced Directives 12/24/2021 12/13/2020 07/07/2020 07/15/2018 02/16/2018 10/10/2016  Does Patient Have a Medical Advance Directive? No No No No No No  Would patient like information on creating a medical advance directive? No - Patient declined No - Patient declined - Yes (MAU/Ambulatory/Procedural Areas - Information given) No - Patient declined No - patient declined information  Some encounter information is confidential and restricted. Go to Review Flowsheets activity to see all data.    Current Medications (verified) Outpatient Encounter Medications as of 12/24/2021  Medication Sig   amLODipine (NORVASC) 10 MG tablet TAKE 1 TABLET BY MOUTH EVERY DAY    atorvastatin (LIPITOR) 10 MG tablet TAKE 1 TABLET BY MOUTH EVERY DAY   cyclobenzaprine (FLEXERIL) 10 MG tablet Take 1 tablet by mouth 2 (two) times daily as needed.   etodolac (LODINE) 400 MG tablet Take 1 tablet by mouth in the morning and at bedtime.   neomycin-polymyxin b-dexamethasone (MAXITROL) 3.5-10000-0.1 OINT SMARTSIG:1 sparingly Left Eye Every Night   neomycin-polymyxin b-dexamethasone (MAXITROL) 3.5-10000-0.1 SUSP Place 1 drop into the left eye 3 (three) times daily.   Omega-3 Fatty Acids (FISH OIL) 1000 MG CPDR Take by mouth.   sildenafil (REVATIO) 20 MG tablet TAKE 1 TABLET(20 MG) BY MOUTH DAILY AS NEEDED   traZODone (DESYREL) 100 MG tablet TAKE 1 TABLET BY MOUTH EVERYDAY AT BEDTIME (Patient taking differently: as needed.)   venlafaxine XR (EFFEXOR-XR) 150 MG 24 hr capsule TAKE 1 CAPSULE BY MOUTH EVERY DAY (Patient taking differently: as needed.)   No facility-administered encounter medications on file as of 12/24/2021.    Allergies (verified) Patient has no known allergies.   History: Past Medical History:  Diagnosis Date   Anxiety    Asthma    BPH (benign prostatic hyperplasia)    Depression    ED (erectile dysfunction)    HTN (hypertension)    Low back pain    Microscopic hematuria    Nocturia    Special screening for malignant neoplasms, colon    Testicular pain, right 01/19/2018   Urgency of micturation    Past Surgical History:  Procedure Laterality Date   COLONOSCOPY WITH PROPOFOL N/A 02/16/2018   Procedure: COLONOSCOPY WITH PROPOFOL;  Surgeon: Lucilla Lame, MD;  Location: [place];  Service: Endoscopy;  Laterality:  N/A;   SHOULDER OPEN ROTATOR CUFF REPAIR Right 03/2016   shoulder surgery Right    Family History  Problem Relation Age of Onset   CAD Mother    COPD Mother    Lung cancer Father    Healthy Sister    Kidney disease Neg Hx    Prostate cancer Neg Hx    Social History   Socioeconomic History   Marital status: Divorced    Spouse  name: Not on file   Number of children: 2   Years of education: some college   Highest education level: 12th grade  Occupational History   Occupation: Disabled  Tobacco Use   Smoking status: Never   Smokeless tobacco: Never   Tobacco comments:    smoking cessation materials not required  Vaping Use   Vaping Use: Never used  Substance and Sexual Activity   Alcohol use: No   Drug use: No   Sexual activity: Yes    Birth control/protection: None  Other Topics Concern   Not on file  Social History Narrative   Pt lives alone   Social Determinants of Health   Financial Resource Strain: Low Risk    Difficulty of Paying Living Expenses: Not hard at all  Food Insecurity: No Food Insecurity   Worried About Charity fundraiser in the Last Year: Never true   [place] in the Last Year: Never true  Transportation Needs: No Transportation Needs   Lack of Transportation (Medical): No   Lack of Transportation (Non-Medical): No  Physical Activity: Sufficiently Active   Days of Exercise per Week: 3 days   Minutes of Exercise per Session: 60 min  Stress: No Stress Concern Present   Feeling of Stress : Not at all  Social Connections: Socially Isolated   Frequency of Communication with Friends and Family: More than three times a week   Frequency of Social Gatherings with Friends and Family: More than three times a week   Attends Religious Services: Never   Marine scientist or Organizations: No   Attends Music therapist: Never   Marital Status: Divorced    Tobacco Counseling Counseling given: Not Answered Tobacco comments: smoking cessation materials not required   Clinical Intake:  Pre-visit preparation completed: Yes  Pain : 0-10 Pain Score: 6  Pain Type: Chronic pain Pain Location: Back (right shoulder also) Pain Orientation: Lower Pain Descriptors / Indicators: Aching, Sore Pain Onset: More than a month ago Pain Frequency: Constant      Nutritional Risks: None Diabetes: No  How often do you need to have someone help you when you read instructions, pamphlets, or other written materials from your doctor or pharmacy?: 1 - Never   Interpreter Needed?: No  Information entered by :: [name] LPN   Activities of Daily Living In your present state of health, do you have any difficulty performing the following activities: 12/24/2021 10/30/2021  Hearing? N N  Vision? N N  Difficulty concentrating or making decisions? Tempie Donning  Walking or climbing stairs? Y Y  Dressing or bathing? N Y  Doing errands, shopping? N Y  Conservation officer, nature and eating ? N -  Using the Toilet? N -  In the past six months, have you accidently leaked urine? N -  Do you have problems with loss of bowel control? N -  Managing your Medications? N -  Managing your Finances? N -  Housekeeping or managing your Housekeeping? N -  Some recent  data might be hidden    Patient Care Team: Glean Hess, MD as PCP - General (Internal Medicine) [facility] spine and pain as Consulting Physician Sharia Reeve, MD as Consulting Physician (Psychiatry) [place], [facility] as Consulting Physician (Orthopedic Surgery) Bufford Buttner, MD as Consulting Physician (Orthopedic Surgery) Laneta Simmers as Physician Assistant (Urology)  Indicate any recent Medical Services you may have received from other than [facility] providers in the past year (date may be approximate).     Assessment:   This is a routine wellness examination for Nicholas [name].  Hearing/Vision screen Hearing Screening - Comments:: Pt denies hearing difficulty Vision Screening - Comments:: Annual vision screenings done by Dr. Edison Pace [facility]  Dietary issues and exercise activities discussed: Current Exercise Habits: Home exercise routine, Type of exercise: stretching, Time (Minutes): 60, Frequency (Times/Week): 3, Weekly Exercise (Minutes/Week): 180, Intensity: Mild, Exercise  limited by: orthopedic condition(s)   Goals Addressed             This Visit's Progress    DIET - INCREASE WATER INTAKE   On track    Recommend to drink at least 6-8 8oz glasses of water per day.       Depression Screen PHQ 2/9 Scores 12/24/2021 10/30/2021 04/24/2021 12/13/2020 06/13/2020 01/25/2020 06/01/2019  PHQ - 2 Score 4 4 0 0 0 0 0  PHQ- 9 Score 7 12 0 - 0 0 0    Fall Risk Fall Risk  12/24/2021 10/30/2021 04/24/2021 12/13/2020 06/13/2020  Falls in the past year? 0 0 0 0 0  Number falls in past yr: 0 0 0 0 0  Injury with Fall? 0 0 0 0 0  Risk for fall due to : No Fall Risks No Fall Risks - No Fall Risks No Fall Risks  Follow up Falls prevention discussed Falls evaluation completed Falls evaluation completed Falls prevention discussed Falls evaluation completed    FALL RISK PREVENTION PERTAINING TO THE HOME:  Any stairs in or around the home? Yes  If so, are there any without handrails? No  Home free of loose throw rugs in walkways, pet beds, electrical cords, etc? Yes  Adequate lighting in your home to reduce risk of falls? Yes   ASSISTIVE DEVICES UTILIZED TO PREVENT FALLS:  Life alert? No  Use of a cane, walker or w/c? No  Grab bars in the bathroom? Yes  Shower chair or bench in shower? No  Elevated toilet seat or a handicapped toilet? No   TIMED UP AND GO:  Was the test performed? No . Telephonic visit.  Cognitive Function: Normal cognitive status assessed by direct observation by this Nurse Health Advisor. No abnormalities found.       6CIT Screen 07/15/2018  What Year? 0 points  What month? 0 points  What time? 0 points  Count back from 20 0 points  Months in reverse 0 points  Repeat phrase 4 points  Total Score 4    Immunizations Immunization History  Administered Date(s) Administered   Moderna Sars-Covid-2 Vaccination 02/17/2020, 03/16/2020, 11/02/2020    TDAP status: Due, Education has been provided regarding the importance of this vaccine.  Advised may receive this vaccine at local pharmacy or Health Dept. Aware to provide a copy of the vaccination record if obtained from local pharmacy or Health Dept. Verbalized acceptance and understanding.  Flu Vaccine status: Declined, Education has been provided regarding the importance of this vaccine but patient still declined. Advised may receive this vaccine at local  pharmacy or Health Dept. Aware to provide a copy of the vaccination record if obtained from local pharmacy or Health Dept. Verbalized acceptance and understanding.  Pneumococcal vaccine status: Declined,  Education has been provided regarding the importance of this vaccine but patient still declined. Advised may receive this vaccine at local pharmacy or Health Dept. Aware to provide a copy of the vaccination record if obtained from local pharmacy or Health Dept. Verbalized acceptance and understanding.   Covid-19 vaccine status: Completed vaccines  Qualifies for Shingles Vaccine? Yes   Zostavax completed No   Shingrix Completed?: No.    Education has been provided regarding the importance of this vaccine. Patient has been advised to call insurance company to determine out of pocket expense if they have not yet received this vaccine. Advised may also receive vaccine at local pharmacy or Health Dept. Verbalized acceptance and understanding.  Screening Tests Health Maintenance  Topic Date Due   TETANUS/TDAP  Never done   Zoster Vaccines- Shingrix (1 of 2) Never done   COVID-19 Vaccine (4 - Booster for Moderna series) 12/28/2020   COLONOSCOPY (Pts 45-65yrs Insurance coverage will need to be confirmed)  02/17/2028   Hepatitis C Screening  Completed   HIV Screening  Completed   HPV VACCINES  Aged Out   INFLUENZA VACCINE  Discontinued    Health Maintenance  Health Maintenance Due  Topic Date Due   TETANUS/TDAP  Never done   Zoster Vaccines- Shingrix (1 of 2) Never done   COVID-19 Vaccine (4 - Booster for Moderna series)  12/28/2020    Colorectal cancer screening: Type of screening: Colonoscopy. Completed 02/16/18. Repeat every 10 years  Lung Cancer Screening: (Low Dose CT Chest recommended if Age 67-80 years, 30 pack-year currently smoking OR have quit w/in 15years.) does not qualify.   Additional Screening:  Hepatitis C Screening: does qualify; Completed 01/19/18  Vision Screening: Recommended annual ophthalmology exams for early detection of glaucoma and other disorders of the eye. Is the patient up to date with their annual eye exam?  Yes  Who is the provider or what is the name of the office in which the patient attends annual eye exams? [facility].   Dental Screening: Recommended annual dental exams for proper oral hygiene  Community Resource Referral / Chronic Care Management: CRR required this visit?  No   CCM required this visit?  No      Plan:     I have personally reviewed and noted the following in the patients chart:   Medical and social history Use of alcohol, tobacco or illicit drugs  Current medications and supplements including opioid prescriptions. Patient is not currently taking opioid prescriptions. Functional ability and status Nutritional status Physical activity Advanced directives List of other physicians Hospitalizations, surgeries, and ER visits in previous 12 months Vitals Screenings to include cognitive, depression, and falls Referrals and appointments  In addition, I have reviewed and discussed with patient certain preventive protocols, quality metrics, and best practice recommendations. A written personalized care plan for preventive services as well as general preventive health recommendations were provided to patient.     [name], LPN   12/04/7251   Nurse Notes: none

## 2021-12-24 NOTE — Patient Instructions (Signed)
[name] , Thank you for taking time to come for your Medicare Wellness Visit. I appreciate your ongoing commitment to your health goals. Please review the following plan we discussed and let me know if I can assist you in the future.   Screening recommendations/referrals: Colonoscopy: done 02/16/18. Repeat 01/2028 Recommended yearly ophthalmology/optometry visit for glaucoma screening and checkup Recommended yearly dental visit for hygiene and checkup  Vaccinations: Influenza vaccine: declined Pneumococcal vaccine: declined Tdap vaccine: due Shingles vaccine: 1st dose completed; due for 2nd dose   Covid-19: done 02/17/20, 03/16/20 & 11/02/20  Advanced directives: Advance directive discussed with you today. Even though you declined this today please call our office should you change your mind and we can give you the proper paperwork for you to fill out.   Conditions/risks identified: Keep up the great work!   Next appointment: Follow up in one year for your annual wellness visit   Preventive Care 40-64 Years, Male Preventive care refers to lifestyle choices and visits with your health care provider that can promote health and wellness. What does preventive care include? A yearly physical exam. This is also called an annual well check. Dental exams once or twice a year. Routine eye exams. Ask your health care provider how often you should have your eyes checked. Personal lifestyle choices, including: Daily care of your teeth and gums. Regular physical activity. Eating a healthy diet. Avoiding tobacco and drug use. Limiting alcohol use. Practicing safe sex. Taking low-dose aspirin every day starting at age 64. What happens during an annual well check? The services and screenings done by your health care provider during your annual well check will depend on your age, overall health, lifestyle risk factors, and family history of disease. Counseling  Your health care provider may  ask you questions about your: Alcohol use. Tobacco use. Drug use. Emotional well-being. Home and relationship well-being. Sexual activity. Eating habits. Work and work Statistician. Screening  You may have the following tests or measurements: Height, weight, and BMI. Blood pressure. Lipid and cholesterol levels. These may be checked every 5 years, or more frequently if you are over 60 years old. Skin check. Lung cancer screening. You may have this screening every year starting at age 30 if you have a 30-pack-year history of smoking and currently smoke or have quit within the past 15 years. Fecal occult blood test (FOBT) of the stool. You may have this test every year starting at age 85. Flexible sigmoidoscopy or colonoscopy. You may have a sigmoidoscopy every 5 years or a colonoscopy every 10 years starting at age 38. Prostate cancer screening. Recommendations will vary depending on your family history and other risks. Hepatitis C blood test. Hepatitis B blood test. Sexually transmitted disease (STD) testing. Diabetes screening. This is done by checking your blood sugar (glucose) after you have not eaten for a while (fasting). You may have this done every 1-3 years. Discuss your test results, treatment options, and if necessary, the need for more tests with your health care provider. Vaccines  Your health care provider may recommend certain vaccines, such as: Influenza vaccine. This is recommended every year. Tetanus, diphtheria, and acellular pertussis (Tdap, Td) vaccine. You may need a Td booster every 10 years. Zoster vaccine. You may need this after age 30. Pneumococcal 13-valent conjugate (PCV13) vaccine. You may need this if you have certain conditions and have not been vaccinated. Pneumococcal polysaccharide (PPSV23) vaccine. You may need one or two doses if you smoke cigarettes or if you  have certain conditions. Talk to your health care provider about which screenings and  vaccines you need and how often you need them. This information is not intended to replace advice given to you by your health care provider. Make sure you discuss any questions you have with your health care provider. Document Released: 12/15/2015 Document Revised: 08/07/2016 Document Reviewed: 09/19/2015 Elsevier Interactive Patient Education  2017 [place] Prevention in the Home Falls can cause injuries. They can happen to people of all ages. There are many things you can do to make your home safe and to help prevent falls. What can I do on the outside of my home? Regularly fix the edges of walkways and driveways and fix any cracks. Remove anything that might make you trip as you walk through a door, such as a raised step or threshold. Trim any bushes or trees on the path to your home. Use bright outdoor lighting. Clear any walking paths of anything that might make someone trip, such as rocks or tools. Regularly check to see if handrails are loose or broken. Make sure that both sides of any steps have handrails. Any raised decks and porches should have guardrails on the edges. Have any leaves, snow, or ice cleared regularly. Use sand or salt on walking paths during winter. Clean up any spills in your garage right away. This includes oil or grease spills. What can I do in the bathroom? Use night lights. Install grab bars by the toilet and in the tub and shower. Do not use towel bars as grab bars. Use non-skid mats or decals in the tub or shower. If you need to sit down in the shower, use a plastic, non-slip stool. Keep the floor dry. Clean up any water that spills on the floor as soon as it happens. Remove soap buildup in the tub or shower regularly. Attach bath mats securely with double-sided non-slip rug tape. Do not have throw rugs and other things on the floor that can make you trip. What can I do in the bedroom? Use night lights. Make sure that you have a light by your  bed that is easy to reach. Do not use any sheets or blankets that are too big for your bed. They should not hang down onto the floor. Have a firm chair that has side arms. You can use this for support while you get dressed. Do not have throw rugs and other things on the floor that can make you trip. What can I do in the kitchen? Clean up any spills right away. Avoid walking on wet floors. Keep items that you use a lot in easy-to-reach places. If you need to reach something above you, use a strong step stool that has a grab bar. Keep electrical cords out of the way. Do not use floor polish or wax that makes floors slippery. If you must use wax, use non-skid floor wax. Do not have throw rugs and other things on the floor that can make you trip. What can I do with my stairs? Do not leave any items on the stairs. Make sure that there are handrails on both sides of the stairs and use them. Fix handrails that are broken or loose. Make sure that handrails are as long as the stairways. Check any carpeting to make sure that it is firmly attached to the stairs. Fix any carpet that is loose or worn. Avoid having throw rugs at the top or bottom of the stairs. If you  do have throw rugs, attach them to the floor with carpet tape. Make sure that you have a light switch at the top of the stairs and the bottom of the stairs. If you do not have them, ask someone to add them for you. What else can I do to help prevent falls? Wear shoes that: Do not have high heels. Have rubber bottoms. Are comfortable and fit you well. Are closed at the toe. Do not wear sandals. If you use a stepladder: Make sure that it is fully opened. Do not climb a closed stepladder. Make sure that both sides of the stepladder are locked into place. Ask someone to hold it for you, if possible. Clearly mark and make sure that you can see: Any grab bars or handrails. First and last steps. Where the edge of each step is. Use tools that  help you move around (mobility aids) if they are needed. These include: Canes. Walkers. Scooters. Crutches. Turn on the lights when you go into a dark area. Replace any light bulbs as soon as they burn out. Set up your furniture so you have a clear path. Avoid moving your furniture around. If any of your floors are uneven, fix them. If there are any pets around you, be aware of where they are. Review your medicines with your doctor. Some medicines can make you feel dizzy. This can increase your chance of falling. Ask your doctor what other things that you can do to help prevent falls. This information is not intended to replace advice given to you by your health care provider. Make sure you discuss any questions you have with your health care provider. Document Released: 09/14/2009 Document Revised: 04/25/2016 Document Reviewed: 12/23/2014 Elsevier Interactive Patient Education  2017 [employer].

## 2021-12-31 DIAGNOSIS — H0014 Chalazion left upper eyelid: Secondary | ICD-10-CM | POA: Diagnosis not present

## 2022-01-08 ENCOUNTER — Ambulatory Visit
Admission: RE | Admit: 2022-01-08 | Discharge: 2022-01-08 | Disposition: A | Discharge status: Home / Self Care | Payer: Worker's Compensation | Source: Ambulatory Visit | Attending: Family Medicine | Admitting: Family Medicine

## 2022-01-08 ENCOUNTER — Other Ambulatory Visit: Payer: Self-pay | Admitting: Family Medicine

## 2022-01-08 ENCOUNTER — Ambulatory Visit
Admission: RE | Admit: 2022-01-08 | Discharge: 2022-01-08 | Disposition: A | Discharge status: Home / Self Care | Payer: Worker's Compensation | Attending: Family Medicine | Admitting: Family Medicine

## 2022-01-08 DIAGNOSIS — M5416 Radiculopathy, lumbar region: Secondary | ICD-10-CM | POA: Insufficient documentation

## 2022-01-22 ENCOUNTER — Other Ambulatory Visit: Payer: Self-pay

## 2022-01-22 ENCOUNTER — Telehealth: Payer: Self-pay | Admitting: Internal Medicine

## 2022-01-22 DIAGNOSIS — N529 Male erectile dysfunction, unspecified: Secondary | ICD-10-CM

## 2022-01-22 DIAGNOSIS — I10 Essential (primary) hypertension: Secondary | ICD-10-CM

## 2022-01-22 DIAGNOSIS — E785 Hyperlipidemia, unspecified: Secondary | ICD-10-CM

## 2022-01-22 MED ORDER — ATORVASTATIN CALCIUM 10 MG PO TABS: 10.0000 mg | ORAL_TABLET | Freq: Every day | ORAL | 0 refills | Status: DC

## 2022-01-22 MED ORDER — AMLODIPINE BESYLATE 10 MG PO TABS: 10.0000 mg | ORAL_TABLET | Freq: Every day | ORAL | 0 refills | Status: DC

## 2022-01-22 MED ORDER — SILDENAFIL CITRATE 20 MG PO TABS: ORAL_TABLET | ORAL | 2 refills | Status: DC

## 2022-01-22 MED ORDER — VENLAFAXINE HCL ER 150 MG PO CP24: 150.0000 mg | ORAL_CAPSULE | Freq: Every day | ORAL | 0 refills | Status: DC

## 2022-01-22 NOTE — Telephone Encounter (Signed)
Sent to [facility] in [place].

## 2022-01-22 NOTE — Telephone Encounter (Signed)
Pt called and reported that he needs all of his medications transferred to [facility] in [place] due to his insurance [employer] [place], [place] [address]  [place] [address]  Phone: [phone] Fax: [phone]

## 2022-01-23 ENCOUNTER — Other Ambulatory Visit: Payer: Self-pay | Admitting: Internal Medicine

## 2022-01-23 DIAGNOSIS — N529 Male erectile dysfunction, unspecified: Secondary | ICD-10-CM

## 2022-01-23 NOTE — Telephone Encounter (Signed)
Duplicate request

## 2022-01-29 DIAGNOSIS — L218 Other seborrheic dermatitis: Secondary | ICD-10-CM | POA: Diagnosis not present

## 2022-01-29 DIAGNOSIS — L2389 Allergic contact dermatitis due to other agents: Secondary | ICD-10-CM | POA: Diagnosis not present

## 2022-02-07 ENCOUNTER — Other Ambulatory Visit: Payer: Self-pay | Admitting: Internal Medicine

## 2022-02-07 DIAGNOSIS — E785 Hyperlipidemia, unspecified: Secondary | ICD-10-CM

## 2022-02-07 NOTE — Telephone Encounter (Signed)
Requested Prescriptions  ?Pending Prescriptions Disp Refills  ?? atorvastatin (LIPITOR) 10 MG tablet [Pharmacy Med Name: ATORVASTATIN '10MG'$  TABLETS] 90 tablet 0  ?  Sig: TAKE 1 TABLET(10 MG) BY MOUTH DAILY  ?  ? Cardiovascular:  Antilipid - Statins Failed - 02/07/2022  8:00 AM  ?  ?  Failed - Lipid Panel in normal range within the last 12 months  ?  Cholesterol, Total  ?Date Value Ref Range Status  ?04/24/2021 197 100 - 199 mg/dL Final  ? ?LDL Chol Calc (NIH)  ?Date Value Ref Range Status  ?04/24/2021 130 (H) 0 - 99 mg/dL Final  ? ?HDL  ?Date Value Ref Range Status  ?04/24/2021 49 >39 mg/dL Final  ? ?Triglycerides  ?Date Value Ref Range Status  ?04/24/2021 98 0 - 149 mg/dL Final  ? ?  ?  ?  Passed - Patient is not pregnant  ?  ?  Passed - Valid encounter within last 12 months  ?  Recent Outpatient Visits   ?      ? 3 months ago Essential (primary) hypertension  ? [facility] Glean Hess, MD  ? 9 months ago Annual physical exam  ? [facility] Glean Hess, MD  ? 1 year ago Hordeolum internum of left upper eyelid  ? [facility] Glean Hess, MD  ? 1 year ago Tinea cruris  ? [facility] Glean Hess, MD  ? 2 years ago Essential (primary) hypertension  ? [facility] Glean Hess, MD  ?  ?  ?Future Appointments   ?        ? In 2 months [employer] Melia Jesse Sans, MD [facility], [place]  ?  ? ?  ?  ?  ? ? ?

## 2022-04-29 ENCOUNTER — Other Ambulatory Visit: Payer: Self-pay | Admitting: Internal Medicine

## 2022-04-29 DIAGNOSIS — E785 Hyperlipidemia, unspecified: Secondary | ICD-10-CM

## 2022-04-29 DIAGNOSIS — I10 Essential (primary) hypertension: Secondary | ICD-10-CM

## 2022-05-01 ENCOUNTER — Encounter: Payer: Medicare (Managed Care) | Admitting: Internal Medicine

## 2022-05-01 NOTE — Telephone Encounter (Signed)
Requested medications are due for refill today.  yes  Requested medications are on the active medications list.  yes  Last refill. 02/07/2022 #90 0 refills  Future visit scheduled.   yes  Notes to clinic.  Medication refill failed protocol due to expired labs.    Requested Prescriptions  Pending Prescriptions Disp Refills   atorvastatin (LIPITOR) 10 MG tablet [Pharmacy Med Name: ATORVASTATIN '10MG'$  TABLETS] 90 tablet 0    Sig: TAKE 1 TABLET(10 MG) BY MOUTH DAILY     Cardiovascular:  Antilipid - Statins Failed - 04/29/2022  8:00 AM      Failed - Lipid Panel in normal range within the last 12 months    Cholesterol, Total  Date Value Ref Range Status  04/24/2021 197 100 - 199 mg/dL Final   LDL Chol Calc (NIH)  Date Value Ref Range Status  04/24/2021 130 (H) 0 - 99 mg/dL Final   HDL  Date Value Ref Range Status  04/24/2021 49 >39 mg/dL Final   Triglycerides  Date Value Ref Range Status  04/24/2021 98 0 - 149 mg/dL Final         Passed - Patient is not pregnant      Passed - Valid encounter within last 12 months    Recent Outpatient Visits           6 months ago Essential (primary) hypertension   [facility] Glean Hess, MD   1 year ago Annual physical exam   [facility] Glean Hess, MD   1 year ago Hordeolum internum of left upper eyelid   [facility] Glean Hess, MD   1 year ago Tinea cruris   [facility] Glean Hess, MD   2 years ago Essential (primary) hypertension   [facility] Glean Hess, MD       Future Appointments             In 4 weeks Glean Hess, MD [facility]             Signed Prescriptions Disp Refills   amLODipine (NORVASC) 10 MG tablet 90 tablet 0    Sig: TAKE 1 TABLET(10 MG) BY MOUTH DAILY     Cardiovascular: Calcium Channel Blockers 2 Passed - 04/29/2022  8:00 AM      Passed - Last BP in normal range    BP Readings from Last 1  Encounters:  10/30/21 122/70         Passed - Last Heart Rate in normal range    Pulse Readings from Last 1 Encounters:  10/30/21 86         Passed - Valid encounter within last 6 months    Recent Outpatient Visits           6 months ago Essential (primary) hypertension   [facility] Glean Hess, MD   1 year ago Annual physical exam   [facility] Glean Hess, MD   1 year ago Hordeolum internum of left upper eyelid   [facility] Glean Hess, MD   1 year ago Tinea cruris   [facility] Glean Hess, MD   2 years ago Essential (primary) hypertension   [facility] Glean Hess, MD       Future Appointments             In 4 weeks [employer] Melia Jesse Sans, MD [facility], [facility]

## 2022-05-01 NOTE — Telephone Encounter (Signed)
Requested Prescriptions  Pending Prescriptions Disp Refills  . amLODipine (NORVASC) 10 MG tablet [Pharmacy Med Name: AMLODIPINE BESYLATE '10MG'$  TABLETS] 90 tablet 0    Sig: TAKE 1 TABLET(10 MG) BY MOUTH DAILY     Cardiovascular: Calcium Channel Blockers 2 Passed - 04/29/2022  8:00 AM      Passed - Last BP in normal range    BP Readings from Last 1 Encounters:  10/30/21 122/70         Passed - Last Heart Rate in normal range    Pulse Readings from Last 1 Encounters:  10/30/21 86         Passed - Valid encounter within last 6 months    Recent Outpatient Visits          6 months ago Essential (primary) hypertension   [facility] Glean Hess, MD   1 year ago Annual physical exam   [facility] Glean Hess, MD   1 year ago Hordeolum internum of left upper eyelid   [facility], [facility]   1 year ago Tinea cruris   [facility] Glean Hess, MD   2 years ago Essential (primary) hypertension   [facility] Glean Hess, MD      Future Appointments            In 4 weeks Army Melia Jesse Sans, 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 atorvastatin (LIPITOR) 10 MG tablet [Pharmacy Med Name: ATORVASTATIN '10MG'$  TABLETS] 90 tablet 0    Sig: TAKE 1 TABLET(10 MG) BY MOUTH DAILY     Cardiovascular:  Antilipid - Statins Failed - 04/29/2022  8:00 AM      Failed - Lipid Panel in normal range within the last 12 months    Cholesterol, Total  Date Value Ref Range Status  04/24/2021 197 100 - 199 mg/dL Final   LDL Chol Calc (NIH)  Date Value Ref Range Status  04/24/2021 130 (H) 0 - 99 mg/dL Final   HDL  Date Value Ref Range Status  04/24/2021 49 >39 mg/dL Final   Triglycerides  Date Value Ref Range Status  04/24/2021 98 0 - 149 mg/dL Final         Passed - Patient is not pregnant      Passed - Valid encounter within last 12 months    Recent Outpatient Visits          6 months ago Essential (primary)  hypertension   [facility] Glean Hess, MD   1 year ago Annual physical exam   [facility] Glean Hess, MD   1 year ago Hordeolum internum of left upper eyelid   [facility] Glean Hess, MD   1 year ago Tinea cruris   [facility] Glean Hess, MD   2 years ago Essential (primary) hypertension   [facility] Glean Hess, MD      Future Appointments            In 4 weeks [employer] Melia Jesse Sans, MD [facility], [facility]

## 2022-05-03 ENCOUNTER — Other Ambulatory Visit: Payer: Self-pay | Admitting: Internal Medicine

## 2022-05-03 DIAGNOSIS — E785 Hyperlipidemia, unspecified: Secondary | ICD-10-CM

## 2022-05-03 NOTE — Telephone Encounter (Signed)
last RF 05/01/22 #30 tabs no RF- must come to 05/29/22 appt. Requested Prescriptions  Refused Prescriptions Disp Refills  . atorvastatin (LIPITOR) 10 MG tablet [Pharmacy Med Name: ATORVASTATIN '10MG'$  TABLETS] 90 tablet     Sig: TAKE 1 TABLET(10 MG) BY MOUTH DAILY     Cardiovascular:  Antilipid - Statins Failed - 05/03/2022  8:12 AM      Failed - Lipid Panel in normal range within the last 12 months    Cholesterol, Total  Date Value Ref Range Status  04/24/2021 197 100 - 199 mg/dL Final   LDL Chol Calc (NIH)  Date Value Ref Range Status  04/24/2021 130 (H) 0 - 99 mg/dL Final   HDL  Date Value Ref Range Status  04/24/2021 49 >39 mg/dL Final   Triglycerides  Date Value Ref Range Status  04/24/2021 98 0 - 149 mg/dL Final         Passed - Patient is not pregnant      Passed - Valid encounter within last 12 months    Recent Outpatient Visits          6 months ago Essential (primary) hypertension   [facility] Glean Hess, MD   1 year ago Annual physical exam   [facility] Glean Hess, MD   1 year ago Hordeolum internum of left upper eyelid   [facility] Glean Hess, MD   1 year ago Tinea cruris   [facility] Glean Hess, MD   2 years ago Essential (primary) hypertension   [facility] Glean Hess, MD      Future Appointments            In 3 weeks Army Melia Jesse Sans, MD [facility], [facility]

## 2022-05-05 ENCOUNTER — Encounter: Payer: Self-pay | Admitting: Emergency Medicine

## 2022-05-05 ENCOUNTER — Ambulatory Visit
Admission: EM | Admit: 2022-05-05 | Discharge: 2022-05-05 | Disposition: A | Discharge status: Home / Self Care | Payer: Medicare (Managed Care) | Attending: Emergency Medicine | Admitting: Emergency Medicine

## 2022-05-05 DIAGNOSIS — N10 Acute tubulo-interstitial nephritis: Secondary | ICD-10-CM | POA: Insufficient documentation

## 2022-05-05 LAB — URINALYSIS, MICROSCOPIC (REFLEX): WBC, UA: >50 WBC/hpf (ref 0–5)

## 2022-05-05 LAB — URINALYSIS, ROUTINE W REFLEX MICROSCOPIC
Glucose, UA: NEGATIVE mg/dL
Ketones, ur: NEGATIVE mg/dL
Nitrite: POSITIVE — AB
Protein, ur: >300 mg/dL — AB
Specific Gravity, Urine: 1.02 (ref 1.005–1.030)
pH: 5.5 (ref 5.0–8.0)

## 2022-05-05 MED ORDER — SULFAMETHOXAZOLE-TRIMETHOPRIM 800-160 MG PO TABS
1.0000 | ORAL_TABLET | Freq: Two times a day (BID) | ORAL | 0 refills | Status: AC
Start: 2022-05-05 — End: 2022-05-19

## 2022-05-05 MED ORDER — ACETAMINOPHEN 500 MG PO TABS
1000.0000 mg | ORAL_TABLET | Freq: Once | ORAL | Status: AC
  Administered 2022-05-05: 1000 mg via ORAL

## 2022-05-05 MED ORDER — IBUPROFEN 600 MG PO TABS: 600.0000 mg | ORAL_TABLET | Freq: Four times a day (QID) | ORAL | 0 refills | Status: DC | PRN

## 2022-05-05 MED ORDER — CEFTRIAXONE SODIUM 1 G IJ SOLR
1.0000 g | Freq: Once | INTRAMUSCULAR | Status: AC
  Administered 2022-05-05: 1 g via INTRAMUSCULAR

## 2022-05-05 NOTE — ED Triage Notes (Signed)
Patient c/o urinary frequency and odor that started yesterday.

## 2022-05-05 NOTE — ED Provider Notes (Signed)
HPI  SUBJECTIVE:  Nicholas [name] is a 56 y.o. male who presents with 2 days of urinary urgency, frequency, cloudy and odorous urine.  No dysuria, hematuria, nausea, vomiting, fevers, body aches, headaches, abdominal, back, pelvic pain, testicular pain or swelling, scrotal pain or swelling, perineal pain or swelling, nocturia, difficulty starting his urinary stream.  He states he feels as if he is able to completely empty his bladder.  No antibiotics in the past month.  No antipyretic in the past 6 hours.  He tried Tylenol 1000 mg and flu pills last night.  He states that Tylenol helped.  No aggravating factors.  No penile rash, discharge.  He is in a long-term monogamous relationship with a male, who is asymptomatic.  STDs are not a concern today.  He has a past medical history of frequent UTIs and states this feels identical to that.  No history of pyelonephritis, nephrolithiasis.  He also has a history of hypertension and BPH.  States that he is not on any medications for the BPH.  No history of prostatitis, diabetes.  PCP: [facility].  Urology: [facility].    Past Medical History:  Diagnosis Date   Anxiety    Asthma    BPH (benign prostatic hyperplasia)    Depression    ED (erectile dysfunction)    HTN (hypertension)    Low back pain    Microscopic hematuria    Nocturia    Special screening for malignant neoplasms, colon    Testicular pain, right 01/19/2018   Urgency of micturation     Past Surgical History:  Procedure Laterality Date   COLONOSCOPY WITH PROPOFOL N/A 02/16/2018   Procedure: COLONOSCOPY WITH PROPOFOL;  Surgeon: Lucilla Lame, MD;  Location: [place];  Service: Endoscopy;  Laterality: N/A;   SHOULDER OPEN ROTATOR CUFF REPAIR Right 03/2016   shoulder surgery Right     Family History  Problem Relation Age of Onset   CAD Mother    COPD Mother    Lung cancer Father    Healthy Sister    Kidney disease Neg Hx    Prostate cancer Neg Hx     Social  History   Tobacco Use   Smoking status: Never   Smokeless tobacco: Never   Tobacco comments:    smoking cessation materials not required  Vaping Use   Vaping Use: Never used  Substance Use Topics   Alcohol use: No   Drug use: No     Current Facility-Administered Medications:    cefTRIAXone (ROCEPHIN) injection 1 g, 1 g, Intramuscular, Once, Melynda Ripple, MD  Current Outpatient Medications:    amLODipine (NORVASC) 10 MG tablet, TAKE 1 TABLET(10 MG) BY MOUTH DAILY, Disp: 90 tablet, Rfl: 0   atorvastatin (LIPITOR) 10 MG tablet, TAKE 1 TABLET(10 MG) BY MOUTH DAILY, Disp: 30 tablet, Rfl: 0   ibuprofen (ADVIL) 600 MG tablet, Take 1 tablet (600 mg total) by mouth every 6 (six) hours as needed., Disp: 30 tablet, Rfl: 0   sulfamethoxazole-trimethoprim (BACTRIM DS) 800-160 MG tablet, Take 1 tablet by mouth 2 (two) times daily for 14 days., Disp: 28 tablet, Rfl: 0   cyclobenzaprine (FLEXERIL) 10 MG tablet, Take 1 tablet by mouth 2 (two) times daily as needed., Disp: , Rfl:    etodolac (LODINE) 400 MG tablet, Take 1 tablet by mouth in the morning and at bedtime., Disp: , Rfl:    neomycin-polymyxin b-dexamethasone (MAXITROL) 3.5-10000-0.1 OINT, SMARTSIG:1 sparingly Left Eye Every Night, Disp: , Rfl:  neomycin-polymyxin b-dexamethasone (MAXITROL) 3.5-10000-0.1 SUSP, Place 1 drop into the left eye 3 (three) times daily., Disp: , Rfl:    Omega-3 Fatty Acids (FISH OIL) 1000 MG CPDR, Take by mouth., Disp: , Rfl:    sildenafil (REVATIO) 20 MG tablet, TAKE 1 TABLET(20 MG) BY MOUTH DAILY AS NEEDED, Disp: 30 tablet, Rfl: 2   traZODone (DESYREL) 100 MG tablet, TAKE 1 TABLET BY MOUTH EVERYDAY AT BEDTIME (Patient taking differently: as needed.), Disp: 30 tablet, Rfl: 5   venlafaxine XR (EFFEXOR-XR) 150 MG 24 hr capsule, Take 1 capsule (150 mg total) by mouth daily., Disp: 90 capsule, Rfl: 0  No Known Allergies   ROS  As noted in HPI.   Physical Exam  BP 131/87 (BP Location: Left Arm)   Pulse  (!) 127   Temp (!) 100.8 F (38.2 C) (Oral)   Resp 15   Ht 6' (1.829 m)   Wt 77.1 kg   SpO2 96%   BMI 23.06 kg/m   Constitutional: Well developed, well nourished, no acute distress.  Appears nontoxic Eyes:  EOMI, conjunctiva normal bilaterally HENT: Normocephalic, atraumatic,mucus membranes moist Respiratory: Normal inspiratory effort Cardiovascular: Tachycardic GI: nondistended soft, nontender.  No suprapubic, flank tenderness Back: No CVAT GU: Normal circumcised male, testes descended bilaterally.  No penile rash, discharge.  No testicular, epididymal tenderness, swelling.  Normal scrotum.  Normal, nontender perineum.  Patient declined chaperone. Rectal: Enlarged, firm, nontender prostate. skin: No rash, skin intact Musculoskeletal: no deformities Neurologic: Alert & oriented x 3, no focal neuro deficits Psychiatric: Speech and behavior appropriate   ED Course   Medications  cefTRIAXone (ROCEPHIN) injection 1 g (has no administration in time range)  acetaminophen (TYLENOL) tablet 1,000 mg (1,000 mg Oral Given 05/05/22 1418)    Orders Placed This Encounter  Procedures   Urine Culture    Standing Status:   Standing    Number of Occurrences:   1    Order Specific Question:   Indication    Answer:   Dysuria   Urinalysis, Routine w reflex microscopic Urine, Clean Catch    Standing Status:   Standing    Number of Occurrences:   1   Urinalysis, Microscopic (reflex)    Standing Status:   Standing    Number of Occurrences:   1    Results for orders placed or performed during the hospital encounter of 05/05/22 (from the past 24 hour(s))  Urinalysis, Routine w reflex microscopic Urine, Clean Catch     Status: Abnormal   Collection Time: 05/05/22  2:17 PM  Result Value Ref Range   Color, Urine AMBER (A) YELLOW   APPearance CLOUDY (A) CLEAR   Specific Gravity, Urine 1.020 1.005 - 1.030   pH 5.5 5.0 - 8.0   Glucose, UA NEGATIVE NEGATIVE mg/dL   Hgb urine dipstick MODERATE  (A) NEGATIVE   Bilirubin Urine SMALL (A) NEGATIVE   Ketones, ur NEGATIVE NEGATIVE mg/dL   Protein, ur >300 (A) NEGATIVE mg/dL   Nitrite POSITIVE (A) NEGATIVE   Leukocytes,Ua SMALL (A) NEGATIVE  Urinalysis, Microscopic (reflex)     Status: Abnormal   Collection Time: 05/05/22  2:17 PM  Result Value Ref Range   RBC / HPF 6-10 0 - 5 RBC/hpf   WBC, UA >50 0 - 5 WBC/hpf   Bacteria, UA MANY (A) NONE SEEN   Squamous Epithelial / LPF 0-5 0 - 5   Non Squamous Epithelial PRESENT (A) NONE SEEN   Granular Casts, UA PRESENT    No  results found.  ED Clinical Impression  1. Acute pyelonephritis      ED Assessment/Plan  UA consistent with UTI with proteinuria, nitrites, esterase, moderate hematuria and many bacteria.  Last positive urine culture was in 2017 which grew out Proteus that was sensitive to Bactrim, Cipro, ceftriaxone, cefuroxime.  Patient with acute illness with systemic symptoms of fever and tachycardia.  Concern for pyelonephritis.  His prostate is enlarged, but not tender.  He does not have any irritative or obstructive voiding symptoms.  Will send home with Bactrim DS 1 tab twice daily for 14 days.  Advised patient to follow-up with his urologist within this time window for reevaluation and possible extension of his antibiotics if it is felt that his symptoms are coming from a prostatitis, rather than a pyelonephritis.  Tylenol/ibuprofen together 3-4 times a day as needed for pain, fever, continue pushing fluids.  Very strict ER return precautions given  Discussed labs, MDM, treatment plan, and plan for follow-up with patient. Discussed sn/sx that should prompt return to the ED. patient agrees with plan.   Meds ordered this encounter  Medications   acetaminophen (TYLENOL) tablet 1,000 mg   sulfamethoxazole-trimethoprim (BACTRIM DS) 800-160 MG tablet    Sig: Take 1 tablet by mouth 2 (two) times daily for 14 days.    Dispense:  28 tablet    Refill:  0   ibuprofen (ADVIL) 600 MG  tablet    Sig: Take 1 tablet (600 mg total) by mouth every 6 (six) hours as needed.    Dispense:  30 tablet    Refill:  0   cefTRIAXone (ROCEPHIN) injection 1 g      *This clinic note was created using [employer]. Therefore, there may be occasional mistakes despite careful proofreading.  ?    Melynda Ripple, MD 05/05/22 1510

## 2022-05-05 NOTE — Discharge Instructions (Addendum)
Drink plenty of extra fluids.  Take 600 mg of ibuprofen combined with 1000 mg of Tylenol together 3-4 times a day as needed for pain, fever.  Make sure you finish the antibiotics, even if you feel better.  I have given you a gram of Rocephin here to cover a kidney infection.  Urinary tract infections are very serious, especially when accompanied with fever and elevated heart rate.  Please follow-up with your urologist in a week or 2 to make sure that you do not need additional antibiotics-prostate infections require 30 days of treatment.  Go to the ER if you are not getting better in 24 hours, or if you get worse in the interim.  Start the Bactrim today.

## 2022-05-07 LAB — URINE CULTURE: Culture: >=100000 — AB

## 2022-05-29 ENCOUNTER — Encounter: Payer: Medicare (Managed Care) | Admitting: Internal Medicine

## 2022-05-31 ENCOUNTER — Telehealth (HOSPITAL_COMMUNITY): Payer: Self-pay | Admitting: Emergency Medicine

## 2022-05-31 NOTE — Telephone Encounter (Signed)
Patient called and spoke to this RN yesterday (05/30/22) requesting a refill of his antibiotic from his visit approx 2 weeks ago.  I reviewed with Dr. Alphonzo Cruise who states she had discussed with patient that he would need to see urology for further evaluation if the symptoms did not resolve with the two weeks of treatment.  Patient states he is improving, but he is not 100% better.  This RN explained that Dr. Alphonzo Cruise does believe he needs to be re-evaluated and assessed for other causes of his symptoms (prostatitis?).  I told patient he could come back to our clinic to be seen if he could not get in with urology.  I updated this to patient who did express his frustration with this decision and asked this RN multiple times for a refill, but when I explained I have no ability to send in medications without a doctors approval he did verbalize understanding.

## 2022-06-06 ENCOUNTER — Ambulatory Visit (INDEPENDENT_AMBULATORY_CARE_PROVIDER_SITE_OTHER): Payer: Medicare (Managed Care) | Admitting: Internal Medicine

## 2022-06-06 ENCOUNTER — Other Ambulatory Visit: Payer: Self-pay | Admitting: Internal Medicine

## 2022-06-06 ENCOUNTER — Encounter: Payer: Self-pay | Admitting: Internal Medicine

## 2022-06-06 VITALS — BP 128/70 | HR 73 | Ht 72.0 in | Wt 169.0 lb

## 2022-06-06 DIAGNOSIS — Z0000 Encounter for general adult medical examination without abnormal findings: Secondary | ICD-10-CM | POA: Diagnosis not present

## 2022-06-06 DIAGNOSIS — I10 Essential (primary) hypertension: Secondary | ICD-10-CM | POA: Diagnosis not present

## 2022-06-06 DIAGNOSIS — F324 Major depressive disorder, single episode, in partial remission: Secondary | ICD-10-CM | POA: Diagnosis not present

## 2022-06-06 DIAGNOSIS — E785 Hyperlipidemia, unspecified: Secondary | ICD-10-CM

## 2022-06-06 DIAGNOSIS — R300 Dysuria: Secondary | ICD-10-CM

## 2022-06-06 DIAGNOSIS — N400 Enlarged prostate without lower urinary tract symptoms: Secondary | ICD-10-CM | POA: Diagnosis not present

## 2022-06-06 LAB — POCT URINALYSIS DIPSTICK
Bilirubin, UA: NEGATIVE
Blood, UA: NEGATIVE
Glucose, UA: NEGATIVE
Ketones, UA: NEGATIVE
Nitrite, UA: POSITIVE
Protein, UA: POSITIVE — AB
Spec Grav, UA: 1.015 (ref 1.010–1.025)
Urobilinogen, UA: 0.2 E.U./dL
pH, UA: 6 (ref 5.0–8.0)

## 2022-06-06 MED ORDER — SULFAMETHOXAZOLE-TRIMETHOPRIM 800-160 MG PO TABS: 1.0000 | ORAL_TABLET | Freq: Two times a day (BID) | ORAL | 0 refills | Status: AC

## 2022-06-06 NOTE — Progress Notes (Signed)
Date:  06/06/2022   Name:  Nicholas [name]   DOB:  1966/01/31   MRN:  [id number]   Chief Complaint: Annual Exam Nicholas [name] is a 56 y.o. male who presents today for his Complete Annual Exam. He feels fairly well. He reports exercising. He reports he is sleeping fairly well.   Colonoscopy: 01/2018 repeat 10 yrs  Immunization History  Administered Date(s) Administered   Moderna Sars-Covid-2 Vaccination 02/17/2020, 03/16/2020, 11/02/2020   Health Maintenance Due  Topic Date Due   TETANUS/TDAP  Never done   Zoster Vaccines- Shingrix (1 of 2) Never done   COVID-19 Vaccine (4 - Booster for Moderna series) 12/28/2020    Lab Results  Component Value Date   PSA1 6.6 (H) 06/22/2021   PSA1 7.7 (H) 05/28/2021   PSA1 7.6 (H) 04/24/2021   PSA 2.5 02/15/2013    Hypertension This is a chronic problem. The problem is controlled. Pertinent negatives include no chest pain, headaches, palpitations or shortness of breath. Past treatments include calcium channel blockers.  Hyperlipidemia This is a chronic problem. The problem is controlled. Pertinent negatives include no chest pain, myalgias or shortness of breath. Current antihyperlipidemic treatment includes statins. The current treatment provides significant improvement of lipids.  Dysuria  This is a recurrent problem. The problem occurs every urination. The problem has been unchanged. The quality of the pain is described as aching. The patient is experiencing no pain. There has been no fever. Associated symptoms include hesitancy and urgency. Pertinent negatives include no chills or frequency. Treatments tried: took Bactrim for 2 weeks. The treatment provided mild (symptoms resolved then returned to a mild degree) relief.    Lab Results  Component Value Date   NA 137 04/24/2021   K 4.1 04/24/2021   CO2 21 04/24/2021   GLUCOSE 91 04/24/2021   BUN 12 04/24/2021   CREATININE 1.16 04/24/2021   CALCIUM 9.4 04/24/2021   EGFR 74  04/24/2021   GFRNONAA 77 01/25/2020   Lab Results  Component Value Date   CHOL 197 04/24/2021   HDL 49 04/24/2021   LDLCALC 130 (H) 04/24/2021   TRIG 98 04/24/2021   CHOLHDL 4.0 04/24/2021   Lab Results  Component Value Date   TSH 2.430 01/19/2018   No results found for: "HGBA1C" Lab Results  Component Value Date   WBC 6.9 04/24/2021   HGB 14.0 04/24/2021   HCT 42.3 04/24/2021   MCV 88 04/24/2021   PLT 283 04/24/2021   Lab Results  Component Value Date   ALT 21 04/24/2021   AST 27 04/24/2021   ALKPHOS 100 04/24/2021   BILITOT 1.0 04/24/2021   No results found for: "25OHVITD2", "25OHVITD3", "VD25OH"   Review of Systems  Constitutional:  Negative for appetite change, chills, diaphoresis, fatigue and unexpected weight change.  HENT:  Negative for hearing loss, tinnitus, trouble swallowing and voice change.   Eyes:  Negative for visual disturbance.  Respiratory:  Negative for choking, shortness of breath and wheezing.   Cardiovascular:  Negative for chest pain, palpitations and leg swelling.  Gastrointestinal:  Negative for abdominal pain, blood in stool, constipation and diarrhea.  Genitourinary:  Positive for dysuria, hesitancy and urgency. Negative for difficulty urinating and frequency.  Musculoskeletal:  Negative for arthralgias, back pain and myalgias.  Skin:  Negative for color change and rash.  Neurological:  Negative for dizziness, syncope and headaches.  Hematological:  Negative for adenopathy.  Psychiatric/Behavioral:  Negative for dysphoric mood and sleep disturbance. The patient is  not nervous/anxious.     Patient Active Problem List   Diagnosis Date Noted   Unspecified inflammatory spondylopathy, lumbar region ([place]) 12/13/2020   Displacement of lumbar intervertebral disc without myelopathy 12/13/2020   Mild hyperlipidemia 01/22/2019   Leukocytosis 07/14/2017   Chronic right shoulder pain 03/27/2017   Major depression single episode, in partial  remission ([place]) 07/18/2016   Generalized anxiety disorder 07/18/2016   ED (erectile dysfunction) 06/17/2015   Essential (primary) hypertension 06/17/2015   Enlarged prostate 06/17/2015    No Known Allergies  Past Surgical History:  Procedure Laterality Date   COLONOSCOPY WITH PROPOFOL N/A 02/16/2018   Procedure: COLONOSCOPY WITH PROPOFOL;  Surgeon: Lucilla Lame, MD;  Location: [facility];  Service: Endoscopy;  Laterality: N/A;   SHOULDER OPEN ROTATOR CUFF REPAIR Right 03/2016   shoulder surgery Right     Social History   Tobacco Use   Smoking status: Never   Smokeless tobacco: Never   Tobacco comments:    smoking cessation materials not required  Vaping Use   Vaping Use: Never used  Substance Use Topics   Alcohol use: No   Drug use: No     Medication list has been reviewed and updated.  Current Meds  Medication Sig   amLODipine (NORVASC) 10 MG tablet TAKE 1 TABLET(10 MG) BY MOUTH DAILY   atorvastatin (LIPITOR) 10 MG tablet TAKE 1 TABLET(10 MG) BY MOUTH DAILY   clobetasol (TEMOVATE) 0.05 % external solution Apply topically.   cyclobenzaprine (FLEXERIL) 10 MG tablet Take 1 tablet by mouth 2 (two) times daily as needed.   etodolac (LODINE) 400 MG tablet Take 1 tablet by mouth in the morning and at bedtime.   hydrocortisone 2.5 % cream Apply topically.   ibuprofen (ADVIL) 600 MG tablet Take 1 tablet (600 mg total) by mouth every 6 (six) hours as needed.   ketoconazole (NIZORAL) 2 % shampoo SMARTSIG:Topical 2-3 Times Weekly   Omega-3 Fatty Acids (FISH OIL) 1000 MG CPDR Take by mouth.   sildenafil (REVATIO) 20 MG tablet TAKE 1 TABLET(20 MG) BY MOUTH DAILY AS NEEDED   traZODone (DESYREL) 100 MG tablet TAKE 1 TABLET BY MOUTH EVERYDAY AT BEDTIME (Patient taking differently: as needed.)   traZODone (DESYREL) 150 MG tablet Take 150 mg by mouth at bedtime.   venlafaxine XR (EFFEXOR-XR) 150 MG 24 hr capsule Take 1 capsule (150 mg total) by mouth daily.       06/06/2022    11:02 AM 10/30/2021    9:18 AM 04/24/2021   10:46 AM 06/13/2020    3:50 PM  GAD 7 : Generalized Anxiety Score  Nervous, Anxious, on Edge 1 2 0 0  Control/stop worrying 1 2 0 0  Worry too much - different things 1 2 0 0  Trouble relaxing 1 2 0 0  Restless 1 1 0 0  Easily annoyed or irritable 1 1 0 0  Afraid - awful might happen 1 1 0 0  Total GAD 7 Score 7 11 0 0  Anxiety Difficulty Somewhat difficult Not difficult at all Not difficult at all Not difficult at all       06/06/2022   11:01 AM 12/24/2021   12:54 PM 10/30/2021    9:18 AM  Depression screen PHQ 2/9  Decreased Interest 1 2 2   Down, Depressed, Hopeless 3 2 2   PHQ - 2 Score 4 4 4   Altered sleeping 2 2 2   Tired, decreased energy 1 1 2   Change in appetite 1 0 1  Feeling bad or failure about yourself  1 0 1  Trouble concentrating 1 0 1  Moving slowly or fidgety/restless 0 0 1  Suicidal thoughts 0 0 0  PHQ-9 Score 10 7 12   Difficult doing work/chores Extremely dIfficult Somewhat difficult Extremely dIfficult    BP Readings from Last 3 Encounters:  06/06/22 128/70  05/05/22 131/87  10/30/21 122/70    Physical Exam Vitals and nursing note reviewed.  Constitutional:      Appearance: Normal appearance. He is well-developed.  HENT:     Head: Normocephalic.     Right Ear: Tympanic membrane, ear canal and external ear normal.     Left Ear: Tympanic membrane, ear canal and external ear normal.     Nose: Nose normal.  Eyes:     Conjunctiva/sclera: Conjunctivae normal.     Pupils: Pupils are equal, round, and reactive to light.  Neck:     Thyroid: No thyromegaly.     Vascular: No carotid bruit.  Cardiovascular:     Rate and Rhythm: Normal rate and regular rhythm.     Heart sounds: Normal heart sounds.  Pulmonary:     Effort: Pulmonary effort is normal.     Breath sounds: Normal breath sounds. No wheezing.  Chest:  Breasts:    Right: No mass.     Left: No mass.  Abdominal:     General: Bowel sounds are normal.      Palpations: Abdomen is soft.     Tenderness: There is no abdominal tenderness.  Musculoskeletal:        General: Normal range of motion.     Cervical back: Normal range of motion and neck supple.  Lymphadenopathy:     Cervical: No cervical adenopathy.  Skin:    General: Skin is warm and dry.  Neurological:     Mental Status: He is alert and oriented to person, place, and time.     Deep Tendon Reflexes: Reflexes are normal and symmetric.  Psychiatric:        Attention and Perception: Attention normal.        Mood and Affect: Mood normal.        Thought Content: Thought content normal.     Wt Readings from Last 3 Encounters:  06/06/22 169 lb (76.7 kg)  05/05/22 170 lb (77.1 kg)  10/30/21 175 lb (79.4 kg)    BP 128/70   Pulse 73   Ht 6' (1.829 m)   Wt 169 lb (76.7 kg)   SpO2 97%   BMI 22.92 kg/m   Assessment and Plan: 1. Annual physical exam Normal exam Continue healthy diet, exercise regularly Up to date on screenings and immunizations.  2. Essential (primary) hypertension Clinically stable exam with well controlled BP. Tolerating medications without side effects at this time. Pt to continue current regimen and low sodium diet; benefits of regular exercise as able discussed. - CBC with Differential/Platelet - Comprehensive metabolic panel  3. Mild hyperlipidemia Tolerating statin medication without side effects at this time Continue same therapy without change at this time. - Lipid panel  4. Enlarged prostate Followed by Urology - last PSA in December was slightly lower and he has a follow in October  5. Dysuria Hx of UTI - no hx of prostatitis. Will treat with Bactrim x 30 days to cover prostatitis and adjust therapy per Urine Culture - POCT Urinalysis Dipstick - Urine Culture - sulfamethoxazole-trimethoprim (BACTRIM DS) 800-160 MG tablet; Take 1 tablet by mouth 2 (two) times daily.  Dispense: 60 tablet; Refill: 0  6. Major depression single episode,  in partial remission ([place]) Doing well on Effexor and Trazodone. Followed by Psychiatry  Partially dictated using Editor, commissioning. Any errors are unintentional.  Halina Maidens, MD [facility]  06/06/2022

## 2022-06-06 NOTE — Patient Instructions (Signed)
Check to see if you got the second Shingles vaccine.

## 2022-06-07 LAB — CBC WITH DIFFERENTIAL/PLATELET
Basophils Absolute: 0.1 10*3/uL (ref 0.0–0.2)
Basos: 1 %
EOS (ABSOLUTE): 0.1 10*3/uL (ref 0.0–0.4)
Eos: 1 %
Hematocrit: 37.4 % — ABNORMAL LOW (ref 37.5–51.0)
Hemoglobin: 12.1 g/dL — ABNORMAL LOW (ref 13.0–17.7)
Immature Grans (Abs): 0.1 10*3/uL (ref 0.0–0.1)
Immature Granulocytes: 1 %
Lymphocytes Absolute: 1.7 10*3/uL (ref 0.7–3.1)
Lymphs: 15 %
MCH: 30.4 pg (ref 26.6–33.0)
MCHC: 32.4 g/dL (ref 31.5–35.7)
MCV: 94 fL (ref 79–97)
Monocytes Absolute: 0.7 10*3/uL (ref 0.1–0.9)
Monocytes: 7 %
Neutrophils Absolute: 8.4 10*3/uL — ABNORMAL HIGH (ref 1.4–7.0)
Neutrophils: 75 %
Platelets: 283 10*3/uL (ref 150–450)
RBC: 3.98 x10E6/uL — ABNORMAL LOW (ref 4.14–5.80)
RDW: 12.2 % (ref 11.6–15.4)
WBC: 11.1 10*3/uL — ABNORMAL HIGH (ref 3.4–10.8)

## 2022-06-07 LAB — LIPID PANEL
Chol/HDL Ratio: 2.8 ratio (ref 0.0–5.0)
Cholesterol, Total: 119 mg/dL (ref 100–199)
HDL: 43 mg/dL (ref >39)
LDL Chol Calc (NIH): 59 mg/dL (ref 0–99)
Triglycerides: 88 mg/dL (ref 0–149)
VLDL Cholesterol Cal: 17 mg/dL (ref 5–40)

## 2022-06-07 LAB — COMPREHENSIVE METABOLIC PANEL
ALT: 19 IU/L (ref 0–44)
AST: 24 IU/L (ref 0–40)
Albumin/Globulin Ratio: 1.5 (ref 1.2–2.2)
Albumin: 4 g/dL (ref 3.8–4.9)
Alkaline Phosphatase: 93 IU/L (ref 44–121)
BUN/Creatinine Ratio: 9 (ref 9–20)
BUN: 10 mg/dL (ref 6–24)
Bilirubin Total: 0.5 mg/dL (ref 0.0–1.2)
CO2: 25 mmol/L (ref 20–29)
Calcium: 9.5 mg/dL (ref 8.7–10.2)
Chloride: 104 mmol/L (ref 96–106)
Creatinine, Ser: 1.09 mg/dL (ref 0.76–1.27)
Globulin, Total: 2.7 g/dL (ref 1.5–4.5)
Glucose: 66 mg/dL — ABNORMAL LOW (ref 70–99)
Potassium: 4.6 mmol/L (ref 3.5–5.2)
Sodium: 143 mmol/L (ref 134–144)
Total Protein: 6.7 g/dL (ref 6.0–8.5)
eGFR: 79 mL/min/{1.73_m2} (ref >59)

## 2022-06-10 LAB — URINE CULTURE

## 2022-07-19 ENCOUNTER — Other Ambulatory Visit: Payer: Medicare (Managed Care)

## 2022-07-31 ENCOUNTER — Other Ambulatory Visit: Payer: Self-pay | Admitting: Internal Medicine

## 2022-07-31 DIAGNOSIS — E785 Hyperlipidemia, unspecified: Secondary | ICD-10-CM

## 2022-07-31 DIAGNOSIS — I10 Essential (primary) hypertension: Secondary | ICD-10-CM

## 2022-07-31 NOTE — Telephone Encounter (Signed)
Requested Prescriptions  Pending Prescriptions Disp Refills  . atorvastatin (LIPITOR) 10 MG tablet [Pharmacy Med Name: ATORVASTATIN '10MG'$  TABLETS] 90 tablet 1    Sig: TAKE 1 TABLET(10 MG) BY MOUTH DAILY     Cardiovascular:  Antilipid - Statins Failed - 07/31/2022  8:00 AM      Failed - Lipid Panel in normal range within the last 12 months    Cholesterol, Total  Date Value Ref Range Status  06/06/2022 119 100 - 199 mg/dL Final   LDL Chol Calc (NIH)  Date Value Ref Range Status  06/06/2022 59 0 - 99 mg/dL Final   HDL  Date Value Ref Range Status  06/06/2022 43 >39 mg/dL Final   Triglycerides  Date Value Ref Range Status  06/06/2022 88 0 - 149 mg/dL Final         Passed - Patient is not pregnant      Passed - Valid encounter within last 12 months    Recent Outpatient Visits          1 month ago Annual physical exam   [place] [facility], Jesse Sans, MD   9 months ago Essential (primary) hypertension   [place] [facility] at [facility], Jesse Sans, MD   1 year ago Annual physical exam   [place] [facility] at [facility], Jesse Sans, MD   2 years ago Hordeolum internum of left upper eyelid   [place] [facility] at [facility], Jesse Sans, MD   2 years ago Tinea cruris   [place] [facility] at [facility], Jesse Sans, MD      Future Appointments            In 4 months Glean Hess, MD [facility] at [facility] [place]           . amLODipine ([place]) 10 MG tablet [Pharmacy Med Name: AMLODIPINE BESYLATE '10MG'$  TABLETS] 90 tablet 0    Sig: TAKE 1 TABLET(10 MG) BY MOUTH DAILY     Cardiovascular: Calcium Channel Blockers 2 Passed - 07/31/2022  8:00 AM      Passed - Last BP in normal range    BP Readings from Last 1 Encounters:  06/06/22  128/70         Passed - Last Heart Rate in normal range    Pulse Readings from Last 1 Encounters:  06/06/22 73         Passed - Valid encounter within last 6 months    Recent Outpatient Visits          1 month ago Annual physical exam   [place] [facility] at [facility], Jesse Sans, MD   9 months ago Essential (primary) hypertension   [place] [facility] at [facility], Jesse Sans, MD   1 year ago Annual physical exam   [place] [facility] at [facility], Jesse Sans, MD   2 years ago Hordeolum internum of left upper eyelid   [place] [facility] at [facility], Jesse Sans, MD   2 years ago Tinea cruris   [place] [facility] at [facility], Jesse Sans, MD      Future Appointments  In 4 months Army Melia, Jesse Sans, MD [place] [facility] at [facility]

## 2022-08-29 DIAGNOSIS — M545 Low back pain, unspecified: Secondary | ICD-10-CM | POA: Insufficient documentation

## 2022-09-02 DIAGNOSIS — Z125 Encounter for screening for malignant neoplasm of prostate: Secondary | ICD-10-CM | POA: Diagnosis not present

## 2022-09-02 DIAGNOSIS — R972 Elevated prostate specific antigen [PSA]: Secondary | ICD-10-CM | POA: Diagnosis not present

## 2022-09-13 ENCOUNTER — Other Ambulatory Visit: Payer: Self-pay | Admitting: Internal Medicine

## 2022-09-13 DIAGNOSIS — E785 Hyperlipidemia, unspecified: Secondary | ICD-10-CM

## 2022-09-13 MED ORDER — ATORVASTATIN CALCIUM 10 MG PO TABS: ORAL_TABLET | ORAL | 0 refills | Status: DC

## 2022-09-13 NOTE — Telephone Encounter (Signed)
Medication Refill - Medication: atorvastatin (LIPITOR) 10 MG tablet  Has the patient contacted their pharmacy? Yes.    Preferred Pharmacy (with phone number or street name):  [id number] - [place], [address] AT [place] Phone:  [phone]  Fax:  [phone]     Has the patient been seen for an appointment in the last year OR does the patient have an upcoming appointment? Yes.    Agent: Please be advised that RX refills may take up to 3 business days. We ask that you follow-up with your pharmacy.

## 2022-09-13 NOTE — Telephone Encounter (Signed)
Requested Prescriptions  Pending Prescriptions Disp Refills  . atorvastatin (LIPITOR) 10 MG tablet 90 tablet 0    Sig: TAKE 1 TABLET(10 MG) BY MOUTH DAILY     Cardiovascular:  Antilipid - Statins Failed - 09/13/2022 11:08 AM      Failed - Lipid Panel in normal range within the last 12 months    Cholesterol, Total  Date Value Ref Range Status  06/06/2022 119 100 - 199 mg/dL Final   LDL Chol Calc (NIH)  Date Value Ref Range Status  06/06/2022 59 0 - 99 mg/dL Final   HDL  Date Value Ref Range Status  06/06/2022 43 >39 mg/dL Final   Triglycerides  Date Value Ref Range Status  06/06/2022 88 0 - 149 mg/dL Final         Passed - Patient is not pregnant      Passed - Valid encounter within last 12 months    Recent Outpatient Visits          3 months ago Annual physical exam   [place] [facility] at [facility], Jesse Sans, MD   10 months ago Essential (primary) hypertension   [place] [facility] at [facility], Jesse Sans, MD   1 year ago Annual physical exam   [place] [facility] at [facility], Jesse Sans, MD   2 years ago Hordeolum internum of left upper eyelid   [place] [facility] at [facility], Jesse Sans, MD   2 years ago Tinea cruris   [place] [facility] at [facility], Jesse Sans, MD      Future Appointments            In 2 months [place] Mel[place], Jesse Sans, MD [place] [facility] at [facility], [facility]

## 2022-11-04 DIAGNOSIS — R972 Elevated prostate specific antigen [PSA]: Secondary | ICD-10-CM | POA: Diagnosis not present

## 2022-11-04 DIAGNOSIS — R9341 Abnormal radiologic findings on diagnostic imaging of renal pelvis, ureter, or bladder: Secondary | ICD-10-CM | POA: Diagnosis not present

## 2022-11-06 DIAGNOSIS — R972 Elevated prostate specific antigen [PSA]: Secondary | ICD-10-CM | POA: Diagnosis not present

## 2022-11-11 ENCOUNTER — Other Ambulatory Visit: Payer: Self-pay | Admitting: Internal Medicine

## 2022-11-11 DIAGNOSIS — I10 Essential (primary) hypertension: Secondary | ICD-10-CM

## 2022-12-06 ENCOUNTER — Telehealth: Payer: Self-pay

## 2022-12-06 NOTE — Telephone Encounter (Signed)
Attempted to contact the patient to reschedule his appt, no answer. LMOM

## 2022-12-10 ENCOUNTER — Ambulatory Visit: Payer: Medicare (Managed Care) | Admitting: Internal Medicine

## 2022-12-14 ENCOUNTER — Other Ambulatory Visit: Payer: Self-pay | Admitting: Internal Medicine

## 2022-12-14 DIAGNOSIS — E785 Hyperlipidemia, unspecified: Secondary | ICD-10-CM

## 2022-12-16 ENCOUNTER — Telehealth: Payer: Self-pay | Admitting: Internal Medicine

## 2022-12-16 NOTE — Telephone Encounter (Signed)
Copied from [place] [id number]. Topic: Medicare AWV >> Dec 16, 2022  2:16 PM Devoria Glassing wrote: Reason for CRM: LM 12/16/22 to r/s AWV. New AWV appt date 12/27/2022 '@2pm'$ . Please confim AWV date change- khc AWV-s CALL [phone] last awv 12/24/21 [facility] Attempted to contact the patient to reschedule his appt, no answer. LMOM

## 2022-12-16 NOTE — Telephone Encounter (Signed)
Requested Prescriptions  Pending Prescriptions Disp Refills   atorvastatin (LIPITOR) 10 MG tablet [Pharmacy Med Name: ATORVASTATIN '10MG'$  TABLETS] 90 tablet 1    Sig: TAKE 1 TABLET(10 MG) BY MOUTH DAILY     Cardiovascular:  Antilipid - Statins Failed - 12/14/2022  9:44 AM      Failed - Lipid Panel in normal range within the last 12 months    Cholesterol, Total  Date Value Ref Range Status  06/06/2022 119 100 - 199 mg/dL Final   LDL Chol Calc (NIH)  Date Value Ref Range Status  06/06/2022 59 0 - 99 mg/dL Final   HDL  Date Value Ref Range Status  06/06/2022 43 >39 mg/dL Final   Triglycerides  Date Value Ref Range Status  06/06/2022 88 0 - 149 mg/dL Final         Passed - Patient is not pregnant      Passed - Valid encounter within last 12 months    Recent Outpatient Visits           6 months ago Annual physical exam   [place] [facility] at [facility], Jesse Sans, MD   1 year ago Essential (primary) hypertension   [place] [facility] at [facility], Jesse Sans, MD   1 year ago Annual physical exam   [place] [facility] at [facility], Jesse Sans, MD   2 years ago Hordeolum internum of left upper eyelid   [place] [facility] at [facility], Jesse Sans, MD   2 years ago [place] [facility] at [facility], Jesse Sans, MD

## 2022-12-25 ENCOUNTER — Ambulatory Visit: Payer: Medicare (Managed Care)

## 2022-12-27 ENCOUNTER — Ambulatory Visit: Payer: Medicare (Managed Care)

## 2023-01-02 ENCOUNTER — Telehealth: Payer: Self-pay | Admitting: Internal Medicine

## 2023-01-02 NOTE — Telephone Encounter (Signed)
Copied from [place] [phone]. Topic: Medicare AWV >> Jan 02, 2023 11:52 AM Devoria Glassing wrote: Reason for CRM: Left message tor patient to schedule Medicare Annual Wellness Visit (AWV) with [place], [place]  Appointment can be an offiice/telephone or virtual visit;  Please call [phone] ask for Juliann Pulse.

## 2023-01-09 ENCOUNTER — Telehealth: Payer: Self-pay | Admitting: Internal Medicine

## 2023-01-09 NOTE — Telephone Encounter (Signed)
Copied from [place] [id number]. Topic: Medicare AWV >> Jan 09, 2023  9:47 AM Devoria Glassing wrote: Reason for CRM: Left message tor patient to schedule Medicare Annual Wellness Visit (AWV) with [place], [place]  Appointment can be an offiice/telephone or virtual visit;  Please call [phone] ask for Juliann Pulse.

## 2023-01-28 DIAGNOSIS — L300 Nummular dermatitis: Secondary | ICD-10-CM | POA: Diagnosis not present

## 2023-02-12 ENCOUNTER — Other Ambulatory Visit: Payer: Self-pay | Admitting: Internal Medicine

## 2023-02-12 DIAGNOSIS — I10 Essential (primary) hypertension: Secondary | ICD-10-CM

## 2023-02-12 NOTE — Telephone Encounter (Signed)
Attempted to call patient to schedule follow up appointment- left message to call office. Courtesy 30 day Rx sent Requested Prescriptions  Pending Prescriptions Disp Refills   amLODipine (NORVASC) 10 MG tablet [Pharmacy Med Name: AMLODIPINE BESYLATE '10MG'$  TABLETS] 90 tablet 0    Sig: TAKE 1 TABLET(10 MG) BY MOUTH DAILY     Cardiovascular: Calcium Channel Blockers 2 Failed - 02/12/2023  3:36 AM      Failed - Valid encounter within last 6 months    Recent Outpatient Visits           8 months ago Annual physical exam   [place] [facility] at [facility], Jesse Sans, MD   1 year ago Essential (primary) hypertension   [place] [facility] at [facility], Jesse Sans, MD   1 year ago Annual physical exam   [facility] at [facility], Jesse Sans, MD   2 years ago Hordeolum internum of left upper eyelid   [place] [facility] at [facility], Jesse Sans, MD   2 years ago Tinea cruris   [place] at [facility], Jesse Sans, MD              Passed - Last BP in normal range    BP Readings from Last 1 Encounters:  06/06/22 128/70         Passed - Last Heart Rate in normal range    Pulse Readings from Last 1 Encounters:  06/06/22 73

## 2023-02-12 NOTE — Telephone Encounter (Signed)
Left voice mail to set up physical for July.

## 2023-02-20 ENCOUNTER — Other Ambulatory Visit: Payer: Self-pay | Admitting: Internal Medicine

## 2023-02-20 DIAGNOSIS — N529 Male erectile dysfunction, unspecified: Secondary | ICD-10-CM

## 2023-02-20 NOTE — Telephone Encounter (Signed)
Requested medication (s) are due for refill today- expired rx  Requested medication (s) are on the active medication list -yes  Future visit scheduled -no  Last refill: 01/22/22 #30 2RF  Notes to clinic: non delegated Rx  Requested Prescriptions  Pending Prescriptions Disp Refills   sildenafil (REVATIO) 20 MG tablet [Pharmacy Med Name: SILDENAFIL 20MG  TABLETS] 30 tablet 2    Sig: TAKE 1 TABLET(20 MG) BY MOUTH DAILY AS NEEDED     Urology: Erectile Dysfunction Agents Passed - 02/20/2023  8:21 AM      Passed - AST in normal range and within 360 days    AST  Date Value Ref Range Status  06/06/2022 24 0 - 40 IU/L Final         Passed - ALT in normal range and within 360 days    ALT  Date Value Ref Range Status  06/06/2022 19 0 - 44 IU/L Final         Passed - Last BP in normal range    BP Readings from Last 1 Encounters:  06/06/22 128/70         Passed - Valid encounter within last 12 months    Recent Outpatient Visits           8 months ago Annual physical exam   [place] [facility] at [facility], Jesse Sans, MD   1 year ago Essential (primary) hypertension   [place] [facility] at [facility], Jesse Sans, MD   1 year ago Annual physical exam   [place] at [facility], Jesse Sans, MD   2 years ago Hordeolum internum of left upper eyelid   [place] at [facility], Jesse Sans, MD   2 years ago Tinea cruris   [place] at [facility], Jesse Sans, MD                 Requested Prescriptions  Pending Prescriptions Disp Refills   sildenafil (REVATIO) 20 MG tablet [Pharmacy Med Name: SILDENAFIL 20MG  TABLETS] 30 tablet 2    Sig: TAKE 1 TABLET(20 MG) BY MOUTH DAILY AS NEEDED     Urology: Erectile Dysfunction Agents Passed - 02/20/2023  8:21 AM      Passed - AST in  normal range and within 360 days    AST  Date Value Ref Range Status  06/06/2022 24 0 - 40 IU/L Final         Passed - ALT in normal range and within 360 days    ALT  Date Value Ref Range Status  06/06/2022 19 0 - 44 IU/L Final         Passed - Last BP in normal range    BP Readings from Last 1 Encounters:  06/06/22 128/70         Passed - Valid encounter within last 12 months    Recent Outpatient Visits           8 months ago Annual physical exam   [facility] at [facility], Jesse Sans, MD   1 year ago Essential (primary) hypertension   [place] [facility] at [facility], Jesse Sans, MD   1 year ago Annual physical exam   [place] at [facility],  Jesse Sans, MD   2 years ago Hordeolum internum of left upper eyelid   [place] [facility] at [facility], Jesse Sans, MD   2 years ago [place] at [facility], Jesse Sans, MD

## 2023-02-21 NOTE — Telephone Encounter (Signed)
Pt called and scheduled his CPE for this year 06/09/2023, he is requesting refills to last until then

## 2023-03-11 DIAGNOSIS — R972 Elevated prostate specific antigen [PSA]: Secondary | ICD-10-CM | POA: Diagnosis not present

## 2023-04-09 DIAGNOSIS — C61 Malignant neoplasm of prostate: Secondary | ICD-10-CM | POA: Diagnosis not present

## 2023-04-09 DIAGNOSIS — R972 Elevated prostate specific antigen [PSA]: Secondary | ICD-10-CM | POA: Diagnosis not present

## 2023-04-17 DIAGNOSIS — C61 Malignant neoplasm of prostate: Secondary | ICD-10-CM | POA: Diagnosis not present

## 2023-05-05 DIAGNOSIS — Z125 Encounter for screening for malignant neoplasm of prostate: Secondary | ICD-10-CM | POA: Diagnosis not present

## 2023-05-05 DIAGNOSIS — C61 Malignant neoplasm of prostate: Secondary | ICD-10-CM | POA: Diagnosis not present

## 2023-05-19 ENCOUNTER — Other Ambulatory Visit: Payer: Self-pay | Admitting: Internal Medicine

## 2023-05-19 DIAGNOSIS — I10 Essential (primary) hypertension: Secondary | ICD-10-CM

## 2023-06-22 ENCOUNTER — Other Ambulatory Visit: Payer: Self-pay | Admitting: Internal Medicine

## 2023-06-22 DIAGNOSIS — E785 Hyperlipidemia, unspecified: Secondary | ICD-10-CM

## 2023-07-05 IMAGING — CR DG LUMBAR SPINE COMPLETE 4+V
5 series · 5 of 5 positions shown · non-contrast
Comparison: None.

CLINICAL DATA: Low back pain

EXAM:
LUMBAR SPINE - COMPLETE 4+ VIEW

[l-spine ap]
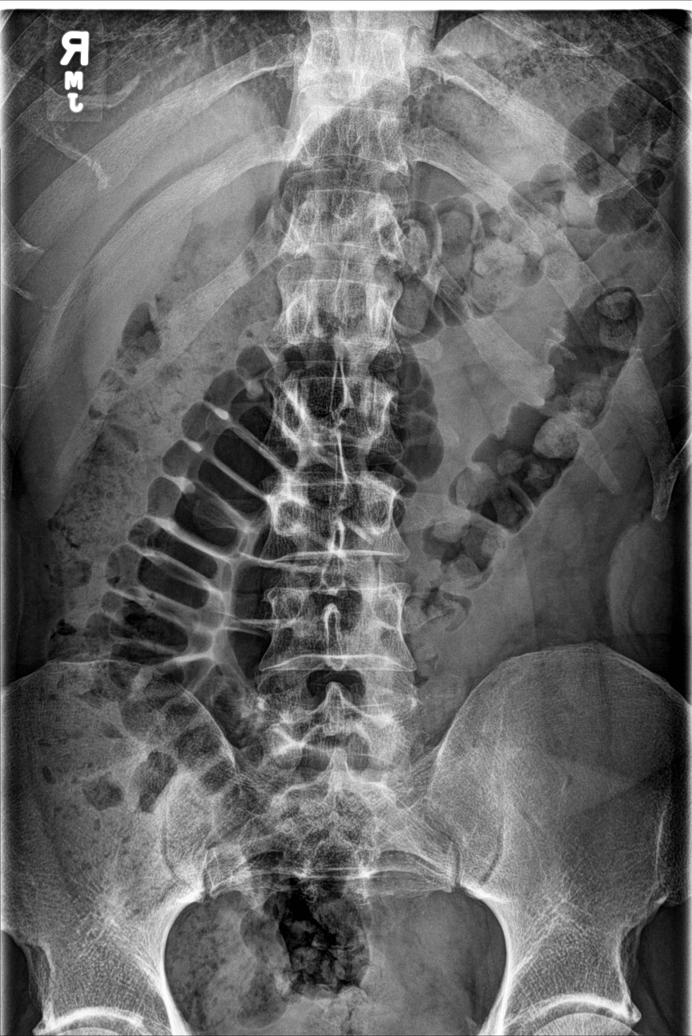

[l-spine obl (1 of 2)]
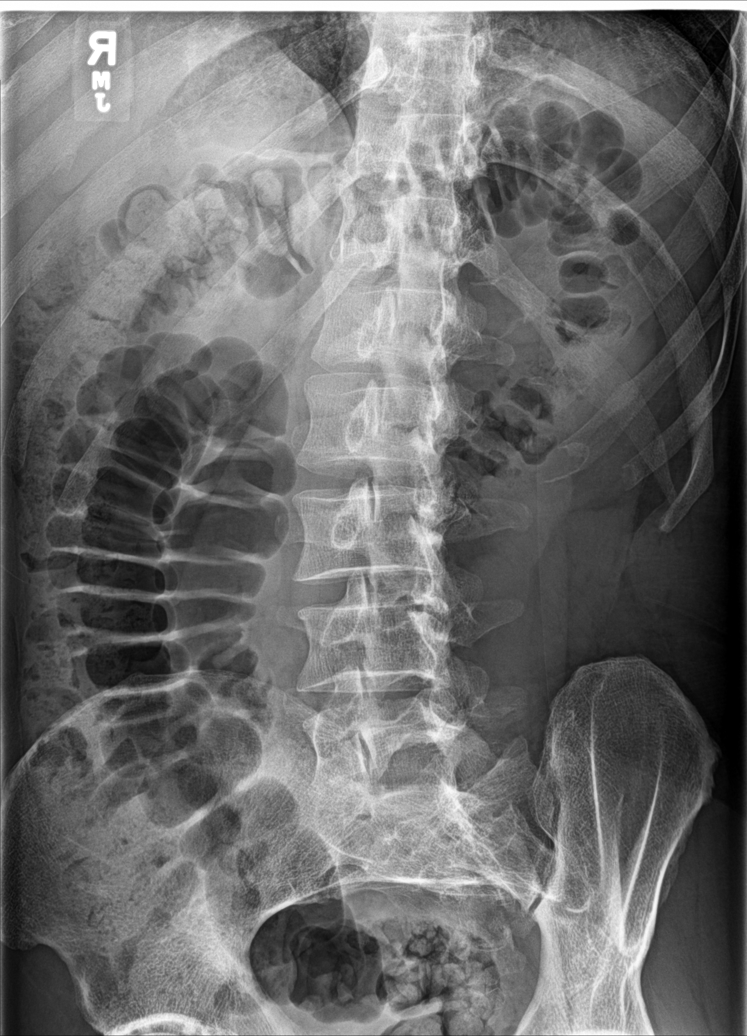

[l-spine obl (2 of 2)]
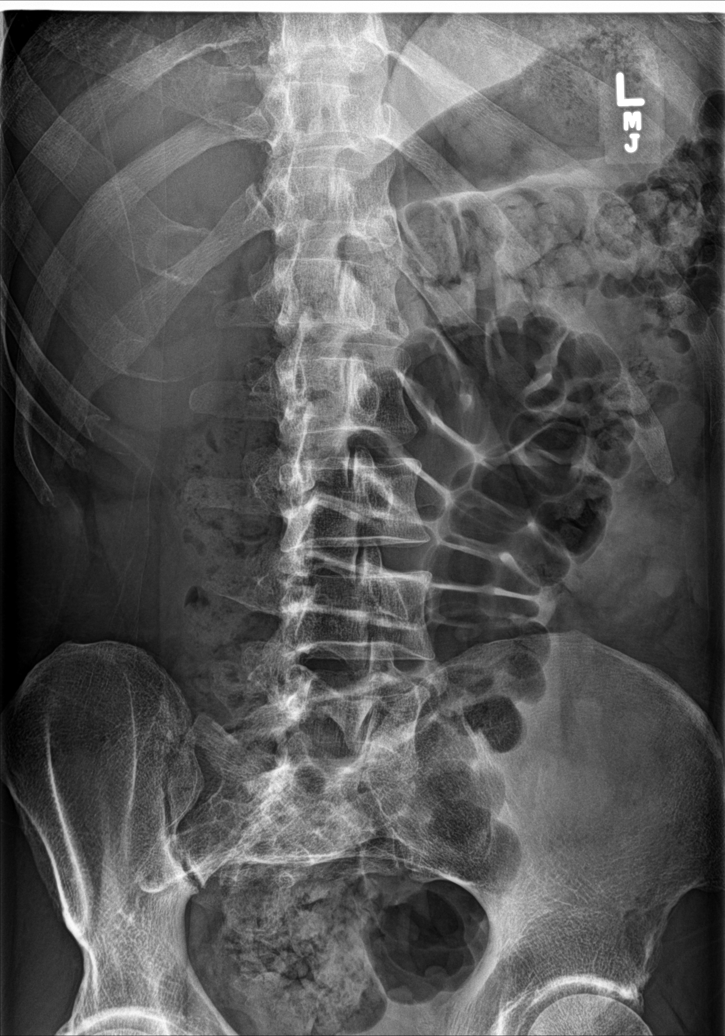

[l-spine lat]
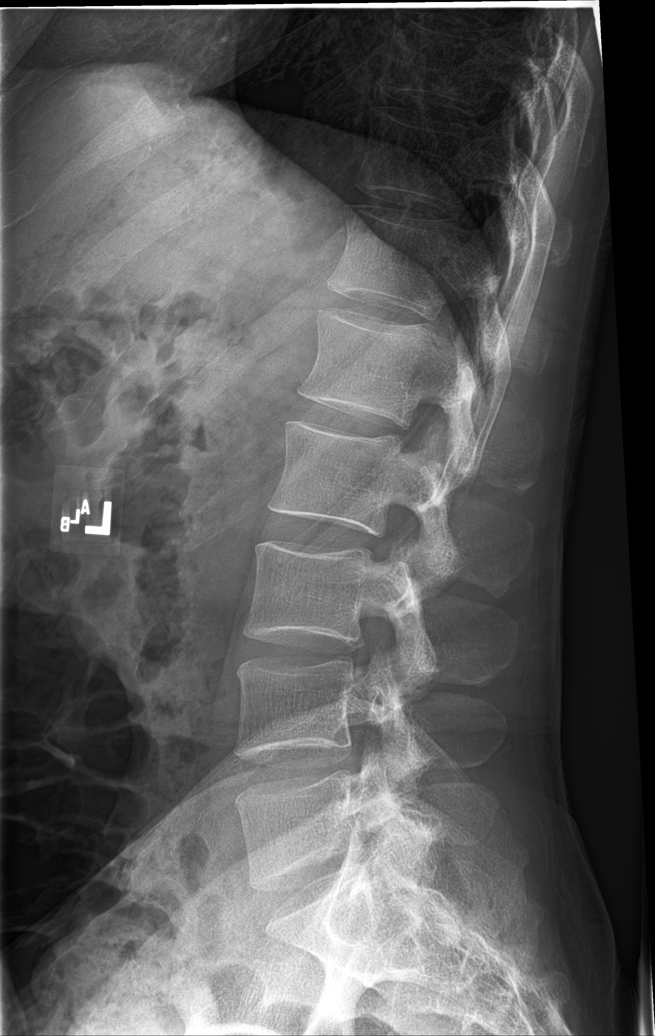

[l-spine spot]
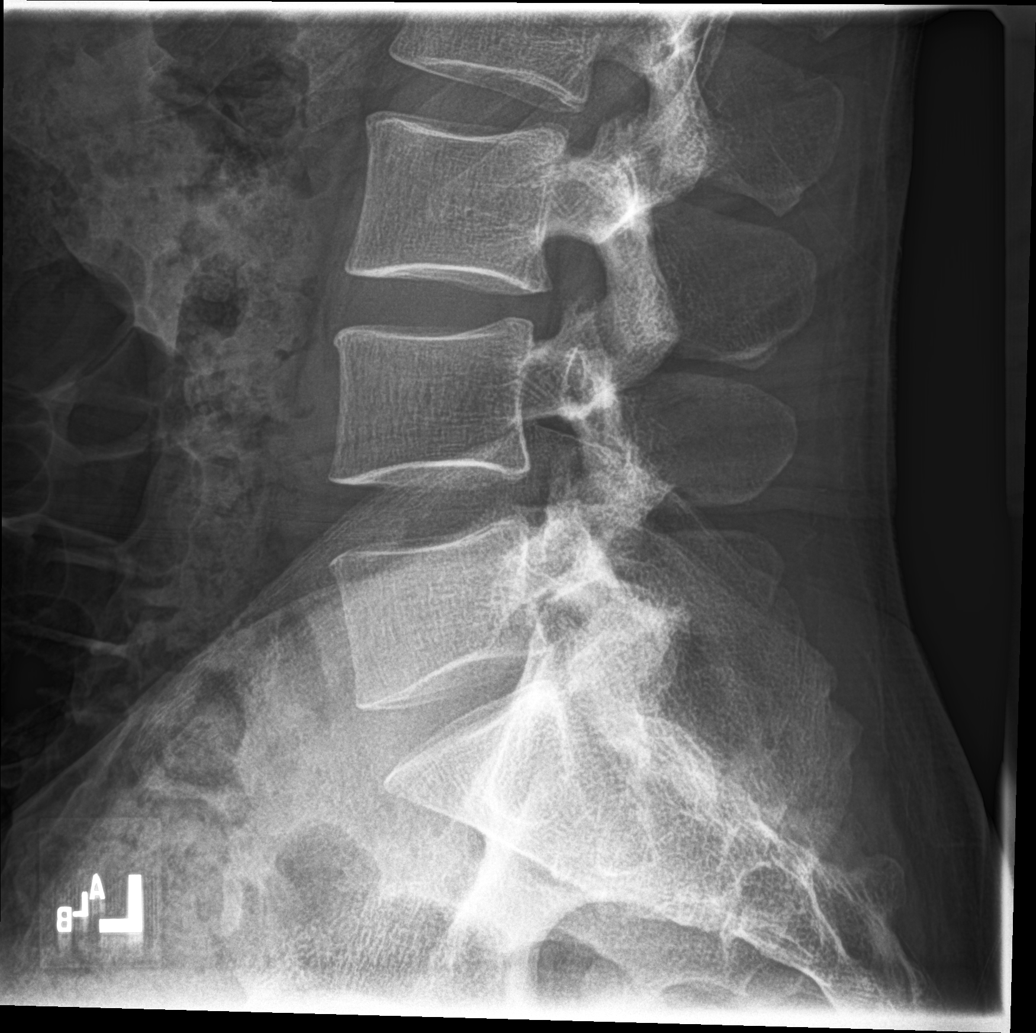

[5 of 5 positions shown; findings below may reference images not displayed]

FINDINGS: Lumbar alignment is normal. Vertebral body heights are normal. The
disc spaces are patent.
IMPRESSION: Negative.

## 2023-07-10 ENCOUNTER — Ambulatory Visit (INDEPENDENT_AMBULATORY_CARE_PROVIDER_SITE_OTHER): Payer: Medicare (Managed Care) | Admitting: Internal Medicine

## 2023-07-10 ENCOUNTER — Encounter: Payer: Self-pay | Admitting: Internal Medicine

## 2023-07-10 VITALS — BP 110/70 | HR 72 | Ht 72.0 in | Wt 164.0 lb

## 2023-07-10 DIAGNOSIS — C61 Malignant neoplasm of prostate: Secondary | ICD-10-CM | POA: Diagnosis not present

## 2023-07-10 DIAGNOSIS — M5126 Other intervertebral disc displacement, lumbar region: Secondary | ICD-10-CM | POA: Diagnosis not present

## 2023-07-10 DIAGNOSIS — E785 Hyperlipidemia, unspecified: Secondary | ICD-10-CM | POA: Diagnosis not present

## 2023-07-10 DIAGNOSIS — I10 Essential (primary) hypertension: Secondary | ICD-10-CM | POA: Diagnosis not present

## 2023-07-10 DIAGNOSIS — F324 Major depressive disorder, single episode, in partial remission: Secondary | ICD-10-CM

## 2023-07-10 DIAGNOSIS — Z0000 Encounter for general adult medical examination without abnormal findings: Secondary | ICD-10-CM

## 2023-07-10 LAB — POCT URINALYSIS DIPSTICK
Bilirubin, UA: NEGATIVE
Blood, UA: NEGATIVE
Glucose, UA: NEGATIVE
Ketones, UA: NEGATIVE
Leukocytes, UA: NEGATIVE
Nitrite, UA: NEGATIVE
Protein, UA: NEGATIVE
Spec Grav, UA: <=1.005 — AB (ref 1.010–1.025)
Urobilinogen, UA: 0.2 E.U./dL
pH, UA: 6 (ref 5.0–8.0)

## 2023-07-10 NOTE — Assessment & Plan Note (Addendum)
Clinically stable with good control of symptoms, No SI or HI. He recently stopped Effexor and only uses Trazodone as needed

## 2023-07-10 NOTE — Assessment & Plan Note (Signed)
LDL is  Lab Results  Component Value Date   LDLCALC 59 06/06/2022   Currently being treated with atorvastatin with good compliance and no concerns.

## 2023-07-10 NOTE — Progress Notes (Signed)
Date:  07/10/2023   Name:  Nicholas [name]   DOB:  July 18, 1966   MRN:  [id number]   Chief Complaint: Annual Exam (Exercises daily, no fatigue, no problems) Nicholas [name] is a 57 y.o. male who presents today for his Complete Annual Exam. He feels well. He reports exercising exercise bike and free weights. He reports he is sleeping fairly well.   Colonoscopy: 01/2018 repeat 10 yrs  Immunization History  Administered Date(s) Administered   Moderna Sars-Covid-2 Vaccination 02/17/2020, 03/16/2020, 11/02/2020   Health Maintenance Due  Topic Date Due   Zoster Vaccines- Shingrix (1 of 2) Never done    Lab Results  Component Value Date   PSA1 6.6 (H) 06/22/2021   PSA1 7.7 (H) 05/28/2021   PSA1 7.6 (H) 04/24/2021   PSA 2.5 02/15/2013   Hypertension This is a chronic problem. The problem is controlled. Pertinent negatives include no chest pain, headaches, palpitations or shortness of breath. Past treatments include calcium channel blockers. There is no history of kidney disease, CAD/MI or CVA.  Hyperlipidemia This is a chronic problem. The problem is controlled. Pertinent negatives include no chest pain, myalgias or shortness of breath. Current antihyperlipidemic treatment includes statins.  Prostate cancer - new dx.  Biopsy showed grade 1 on all cores.  Recommended mid year PSA and follow up in March.   Lab Results  Component Value Date   NA 143 06/06/2022   K 4.6 06/06/2022   CO2 25 06/06/2022   GLUCOSE 66 (L) 06/06/2022   BUN 10 06/06/2022   CREATININE 1.09 06/06/2022   CALCIUM 9.5 06/06/2022   EGFR 79 06/06/2022   GFRNONAA 77 01/25/2020   Lab Results  Component Value Date   CHOL 119 06/06/2022   HDL 43 06/06/2022   LDLCALC 59 06/06/2022   TRIG 88 06/06/2022   CHOLHDL 2.8 06/06/2022   Lab Results  Component Value Date   TSH 2.430 01/19/2018   No results found for: "HGBA1C" Lab Results  Component Value Date   WBC 11.1 (H) 06/06/2022   HGB 12.1 (L)  06/06/2022   HCT 37.4 (L) 06/06/2022   MCV 94 06/06/2022   PLT 283 06/06/2022   Lab Results  Component Value Date   ALT 19 06/06/2022   AST 24 06/06/2022   ALKPHOS 93 06/06/2022   BILITOT 0.5 06/06/2022   No results found for: "25OHVITD2", "25OHVITD3", "VD25OH"   Review of Systems  Constitutional:  Negative for appetite change, chills, diaphoresis, fatigue and unexpected weight change.  HENT:  Negative for hearing loss, trouble swallowing and voice change.   Eyes:  Negative for visual disturbance.  Respiratory:  Negative for choking, shortness of breath and wheezing.   Cardiovascular:  Negative for chest pain, palpitations and leg swelling.  Gastrointestinal:  Negative for abdominal pain, blood in stool, constipation and diarrhea.  Genitourinary:  Negative for difficulty urinating, dysuria, frequency and hematuria.  Musculoskeletal:  Positive for back pain. Negative for myalgias. Arthralgias: intermittent shoulder pain. Skin:  Negative for color change and rash.  Neurological:  Negative for dizziness and headaches.  Hematological:  Negative for adenopathy.  Psychiatric/Behavioral:  Positive for sleep disturbance (intermittent). Negative for dysphoric mood. The patient is not nervous/anxious.     Patient Active Problem List   Diagnosis Date Noted   Displacement of lumbar intervertebral disc without myelopathy 12/13/2020   Mild hyperlipidemia 01/22/2019   Leukocytosis 07/14/2017   Chronic right shoulder pain 03/27/2017   Major depression single episode, in partial remission (HCC) 07/18/2016  Generalized anxiety disorder 07/18/2016   ED (erectile dysfunction) 06/17/2015   Essential (primary) hypertension 06/17/2015   Adenocarcinoma of prostate (HCC) 06/17/2015    No Known Allergies  Past Surgical History:  Procedure Laterality Date   COLONOSCOPY WITH PROPOFOL N/A 02/16/2018   Procedure: COLONOSCOPY WITH PROPOFOL;  Surgeon: Midge Minium, MD;  [facility];   Service: Endoscopy;  Laterality: N/A;   SHOULDER OPEN ROTATOR CUFF REPAIR Right 03/2016   shoulder surgery Right     Social History   Tobacco Use   Smoking status: Never   Smokeless tobacco: Never   Tobacco comments:    smoking cessation materials not required  Vaping Use   Vaping status: Never Used  Substance Use Topics   Alcohol use: No   Drug use: No     Medication list has been reviewed and updated.  Current Meds  Medication Sig   amLODipine (NORVASC) 10 MG tablet TAKE 1 TABLET(10 MG) BY MOUTH DAILY   atorvastatin (LIPITOR) 10 MG tablet TAKE 1 TABLET(10 MG) BY MOUTH DAILY   Omega-3 Fatty Acids (FISH OIL) 1000 MG CPDR Take by mouth.   sildenafil (REVATIO) 20 MG tablet TAKE 1 TABLET(20 MG) BY MOUTH DAILY AS NEEDED   traZODone (DESYREL) 100 MG tablet TAKE 1 TABLET BY MOUTH EVERYDAY AT BEDTIME (Patient taking differently: as needed.)   traZODone (DESYREL) 150 MG tablet Take 150 mg by mouth at bedtime.   [DISCONTINUED] ibuprofen (ADVIL) 600 MG tablet Take 1 tablet (600 mg total) by mouth every 6 (six) hours as needed.       07/10/2023    9:06 AM 06/06/2022   11:02 AM 10/30/2021    9:18 AM 04/24/2021   10:46 AM  GAD 7 : Generalized Anxiety Score  Nervous, Anxious, on Edge 0 1 2 0  Control/stop worrying 0 1 2 0  Worry too much - different things 0 1 2 0  Trouble relaxing 0 1 2 0  Restless 0 1 1 0  Easily annoyed or irritable 0 1 1 0  Afraid - awful might happen 0 1 1 0  Total GAD 7 Score 0 7 11 0  Anxiety Difficulty Not difficult at all Somewhat difficult Not difficult at all Not difficult at all       07/10/2023    9:06 AM 06/06/2022   11:01 AM 12/24/2021   12:54 PM  Depression screen PHQ 2/9  Decreased Interest 0 1 2  Down, Depressed, Hopeless 0 3 2  PHQ - 2 Score 0 4 4  Altered sleeping 0 2 2  Tired, decreased energy 0 1 1  Change in appetite 0 1 0  Feeling bad or failure about yourself  0 1 0  Trouble concentrating 0 1 0  Moving slowly or fidgety/restless 0 0 0   Suicidal thoughts 0 0 0  PHQ-9 Score 0 10 7  Difficult doing work/chores Not difficult at all Extremely dIfficult Somewhat difficult    BP Readings from Last 3 Encounters:  07/10/23 110/70  06/06/22 128/70  05/05/22 131/87    Physical Exam Vitals and nursing note reviewed.  Constitutional:      Appearance: Normal appearance. He is well-developed.  HENT:     Head: Normocephalic.     Right Ear: Tympanic membrane, ear canal and external ear normal.     Left Ear: Tympanic membrane, ear canal and external ear normal.     Nose: Nose normal.  Eyes:     Conjunctiva/sclera: Conjunctivae normal.  Pupils: Pupils are equal, round, and reactive to light.  Neck:     Thyroid: No thyromegaly.     Vascular: No carotid bruit.  Cardiovascular:     Rate and Rhythm: Normal rate and regular rhythm.     Heart sounds: Normal heart sounds.  Pulmonary:     Effort: Pulmonary effort is normal.     Breath sounds: Normal breath sounds. No wheezing.  Chest:  Breasts:    Right: No mass.     Left: No mass.  Abdominal:     General: Bowel sounds are normal.     Palpations: Abdomen is soft.     Tenderness: There is no abdominal tenderness.  Musculoskeletal:        General: Normal range of motion.     Cervical back: Normal range of motion and neck supple.     Right lower leg: No edema.     Left lower leg: No edema.  Lymphadenopathy:     Cervical: No cervical adenopathy.  Skin:    General: Skin is warm and dry.     Capillary Refill: Capillary refill takes less than 2 seconds.  Neurological:     General: No focal deficit present.     Mental Status: He is alert and oriented to person, place, and time.     Deep Tendon Reflexes: Reflexes are normal and symmetric.  Psychiatric:        Attention and Perception: Attention normal.        Mood and Affect: Mood normal.        Thought Content: Thought content normal.     Wt Readings from Last 3 Encounters:  07/10/23 164 lb (74.4 kg)  06/06/22 169  lb (76.7 kg)  05/05/22 170 lb (77.1 kg)    BP 110/70   Pulse 72   Ht 6' (1.829 m)   Wt 164 lb (74.4 kg)   SpO2 98%   BMI 22.24 kg/m   Assessment and Plan:  Problem List Items Addressed This Visit       Unprioritized   Mild hyperlipidemia (Chronic)    LDL is  Lab Results  Component Value Date   LDLCALC 59 06/06/2022   Currently being treated with atorvastatin with good compliance and no concerns.       Relevant Orders   Lipid panel   Major depression single episode, in partial remission (HCC) (Chronic)    Clinically stable with good control of symptoms, No SI or HI. He recently stopped Effexor and only uses Trazodone as needed      Relevant Orders   TSH   Essential (primary) hypertension (Chronic)    Normal exam with stable BP on amlodipine. No concerns or side effects to current medication. No change in regimen; continue low sodium diet.       Relevant Orders   CBC with Differential/Platelet   Comprehensive metabolic panel   POCT urinalysis dipstick (Completed)   Displacement of lumbar intervertebral disc without myelopathy    Uses muscle relaxants and Etodolac as needed.      Adenocarcinoma of prostate [facility])    Being followed by [facility] under active surveillance Will repeat PSA today      Relevant Orders   PSA   Other Visit Diagnoses     Annual physical exam    -  Primary   Needs to get second Shingrix at pharmacy he will message with the dates       Return in about 6 months (around 01/10/2024) for HTN.  Reubin Milan, MD [facility] Primary Care and Sports Medicine [place]

## 2023-07-10 NOTE — Assessment & Plan Note (Signed)
Uses muscle relaxants and Etodolac as needed.

## 2023-07-10 NOTE — Assessment & Plan Note (Signed)
Normal exam with stable BP on amlodipine. No concerns or side effects to current medication. No change in regimen; continue low sodium diet.

## 2023-07-10 NOTE — Assessment & Plan Note (Addendum)
Being followed by [facility] under active surveillance Will repeat PSA today

## 2023-08-29 ENCOUNTER — Other Ambulatory Visit: Payer: Self-pay | Admitting: Internal Medicine

## 2023-08-29 DIAGNOSIS — I10 Essential (primary) hypertension: Secondary | ICD-10-CM

## 2023-08-29 NOTE — Telephone Encounter (Signed)
Requested Prescriptions  Pending Prescriptions Disp Refills   amLODipine (NORVASC) 10 MG tablet [Pharmacy Med Name: AMLODIPINE BESYLATE 10MG  TABLETS] 90 tablet 0    Sig: TAKE 1 TABLET(10 MG) BY MOUTH DAILY     Cardiovascular: Calcium Channel Blockers 2 Passed - 08/29/2023  8:00 AM      Passed - Last BP in normal range    BP Readings from Last 1 Encounters:  07/10/23 110/70         Passed - Last Heart Rate in normal range    Pulse Readings from Last 1 Encounters:  07/10/23 72         Passed - Valid encounter within last 6 months    Recent Outpatient Visits           1 month ago Annual physical exam   [place] [facility] at [facility], Nyoka Cowden, MD   1 year ago Annual physical exam   [facility] at [facility], Nyoka Cowden, MD   1 year ago Essential (primary) hypertension   [place] [facility] at [facility], Nyoka Cowden, MD   2 years ago Annual physical exam   [facility] at [facility], Nyoka Cowden, MD   3 years ago Hordeolum internum of left upper eyelid   [place] [facility] Medicine at [facility], Nyoka Cowden, MD       Future Appointments             In 3 months Judithann Graves, Nyoka Cowden, MD [facility] Primary Care & Sports Medicine at [facility], [facility]

## 2023-10-14 ENCOUNTER — Other Ambulatory Visit: Payer: Self-pay | Admitting: Internal Medicine

## 2023-10-14 DIAGNOSIS — E785 Hyperlipidemia, unspecified: Secondary | ICD-10-CM

## 2023-10-15 NOTE — Telephone Encounter (Signed)
Rx 06/22/23 #90 1RF- too soon Requested Prescriptions  Pending Prescriptions Disp Refills   atorvastatin (LIPITOR) 10 MG tablet [Pharmacy Med Name: ATORVASTATIN 10MG  TABLETS] 90 tablet 1    Sig: TAKE 1 TABLET(10 MG) BY MOUTH DAILY     Cardiovascular:  Antilipid - Statins Failed - 10/14/2023  1:30 PM      Failed - Lipid Panel in normal range within the last 12 months    Cholesterol, Total  Date Value Ref Range Status  07/10/2023 141 100 - 199 mg/dL Final   LDL Chol Calc (NIH)  Date Value Ref Range Status  07/10/2023 73 0 - 99 mg/dL Final   HDL  Date Value Ref Range Status  07/10/2023 58 >39 mg/dL Final   Triglycerides  Date Value Ref Range Status  07/10/2023 46 0 - 149 mg/dL Final         Passed - Patient is not pregnant      Passed - Valid encounter within last 12 months    Recent Outpatient Visits           3 months ago Annual physical exam   [place] [facility] at [facility], Nyoka Cowden, MD   1 year ago Annual physical exam   [facility] Medicine at [facility], Nyoka Cowden, MD   1 year ago Essential (primary) hypertension   [place] [facility] Medicine at [facility], Nyoka Cowden, MD   2 years ago Annual physical exam   [facility] at [facility], Nyoka Cowden, MD   3 years ago Hordeolum internum of left upper eyelid   [place] [facility] Care [facility] Sports Medicine at [facility], Nyoka Cowden, MD       Future Appointments             In 2 months Judithann Graves, Nyoka Cowden, MD [facility] Primary Care & Sports Medicine at [facility], [facility]

## 2023-11-14 ENCOUNTER — Encounter: Payer: Self-pay | Admitting: Emergency Medicine

## 2023-11-14 ENCOUNTER — Ambulatory Visit
Admission: EM | Admit: 2023-11-14 | Discharge: 2023-11-14 | Disposition: A | Discharge status: Home / Self Care | Payer: Medicare (Managed Care) | Attending: Family Medicine | Admitting: Family Medicine

## 2023-11-14 DIAGNOSIS — J329 Chronic sinusitis, unspecified: Secondary | ICD-10-CM | POA: Diagnosis not present

## 2023-11-14 MED ORDER — PREDNISONE 10 MG (21) PO TBPK
ORAL_TABLET | Freq: Every day | ORAL | 0 refills | Status: DC
Start: 2023-11-14 — End: 2024-01-27

## 2023-11-14 MED ORDER — AMOXICILLIN-POT CLAVULANATE 875-125 MG PO TABS
1.0000 | ORAL_TABLET | Freq: Two times a day (BID) | ORAL | 0 refills | Status: DC
Start: 2023-11-14 — End: 2024-01-27

## 2023-11-14 NOTE — ED Triage Notes (Signed)
Patient reports nasal congestion for 2 weeks.  Patient has tried OTC cold medicine.  Patient denies any other cold symptoms.  Patient denies fevers.

## 2023-11-14 NOTE — ED Provider Notes (Signed)
[facility]    CSN: [id number] Arrival date & time: 11/14/23  0911      History   Chief Complaint Chief Complaint  Patient presents with   Nasal Congestion    HPI Issack [name] is a 57 y.o. male.   HPI  History obtained from the patient. Nicholas [name] presents for nasal congestion for the past 2 weeks. Tried Mucinex and tylenol sinus which helps but short term.  He is blowing his nose recently.  No fever, headache or facial pain.    Nicholas [name] has otherwise been well and has no other concerns.      Past Medical History:  Diagnosis Date   Anxiety    Asthma    BPH (benign prostatic hyperplasia)    Depression    ED (erectile dysfunction)    HTN (hypertension)    Low back pain    Microscopic hematuria    Nocturia    Special screening for malignant neoplasms, colon    Testicular pain, right 01/19/2018   Urgency of micturation     Patient Active Problem List   Diagnosis Date Noted   Displacement of lumbar intervertebral disc without myelopathy 12/13/2020   Mild hyperlipidemia 01/22/2019   Leukocytosis 07/14/2017   Chronic right shoulder pain 03/27/2017   Major depression single episode, in partial remission (HCC) 07/18/2016   Generalized anxiety disorder 07/18/2016   ED (erectile dysfunction) 06/17/2015   Essential (primary) hypertension 06/17/2015   Adenocarcinoma of prostate (HCC) 06/17/2015    Past Surgical History:  Procedure Laterality Date   COLONOSCOPY WITH PROPOFOL N/A 02/16/2018   Procedure: COLONOSCOPY WITH PROPOFOL;  Surgeon: Midge Minium, MD;  Location: [facility];  Service: Endoscopy;  Laterality: N/A;   SHOULDER OPEN ROTATOR CUFF REPAIR Right 03/2016   shoulder surgery Right        Home Medications    Prior to Admission medications   Medication Sig Start Date End Date Taking? Authorizing Provider  amoxicillin-clavulanate (AUGMENTIN) 875-125 MG tablet Take 1 tablet by mouth every 12 (twelve) hours. 11/14/23  Yes Nathaneal Sommers,  DO  predniSONE (STERAPRED UNI-PAK 21 TAB) 10 MG (21) TBPK tablet Take by mouth daily. Take 6 tabs by mouth daily for 1, then 5 tabs for 1 day, then 4 tabs for 1 day, then 3 tabs for 1 day, then 2 tabs for 1 day, then 1 tab for 1 day. 11/14/23  Yes Muhammadali Ries, DO  amLODipine (NORVASC) 10 MG tablet TAKE 1 TABLET(10 MG) BY MOUTH DAILY 08/29/23   Reubin Milan, MD  atorvastatin (LIPITOR) 10 MG tablet TAKE 1 TABLET(10 MG) BY MOUTH DAILY 06/22/23   Reubin Milan, MD  clobetasol (TEMOVATE) 0.05 % external solution Apply topically. Patient not taking: Reported on 07/10/2023 01/29/22   [provider]  cyclobenzaprine (FLEXERIL) 10 MG tablet Take 1 tablet by mouth 2 (two) times daily as needed. Patient not taking: Reported on 07/10/2023 09/08/18   [provider]  etodolac (LODINE) 400 MG tablet Take 1 tablet by mouth in the morning and at bedtime. Patient not taking: Reported on 07/10/2023    [provider]  Omega-3 Fatty Acids (FISH OIL) 1000 MG CPDR Take by mouth.    [provider]  sildenafil (REVATIO) 20 MG tablet TAKE 1 TABLET(20 MG) BY MOUTH DAILY AS NEEDED 01/22/22   Reubin Milan, MD  traZODone (DESYREL) 100 MG tablet TAKE 1 TABLET BY MOUTH EVERYDAY AT BEDTIME Patient taking differently: as needed. 10/09/18   Reubin Milan, MD  traZODone (DESYREL) 150 MG tablet Take 150 mg by mouth at bedtime. 12/25/21   [provider]  venlafaxine XR (EFFEXOR-XR) 150 MG 24 hr capsule Take 1 capsule (150 mg total) by mouth daily. Patient not taking: Reported on 07/10/2023 01/22/22   Reubin Milan, MD    Family History Family History  Problem Relation Age of Onset   CAD Mother    COPD Mother    Lung cancer Father    Healthy Sister    Kidney disease Neg Hx    Prostate cancer Neg Hx     Social History Social History   Tobacco Use   Smoking status: Never   Smokeless tobacco: Never   Tobacco comments:    smoking cessation materials not required   Vaping Use   Vaping status: Never Used  Substance Use Topics   Alcohol use: No   Drug use: No     Allergies   Patient has no known allergies.   Review of Systems Review of Systems: negative unless otherwise stated in HPI.      Physical Exam Triage Vital Signs ED Triage Vitals  Encounter Vitals Group     BP 11/14/23 0924 127/73     Systolic BP Percentile --      Diastolic BP Percentile --      Pulse Rate 11/14/23 0924 64     Resp 11/14/23 0924 15     Temp 11/14/23 0924 98 F (36.7 C)     Temp Source 11/14/23 0924 Oral     SpO2 11/14/23 0924 97 %     Weight 11/14/23 0922 164 lb 0.4 oz (74.4 kg)     Height 11/14/23 0922 6' (1.829 m)     Head Circumference --      Peak Flow --      Pain Score 11/14/23 0922 0     Pain Loc --      Pain Education --      Exclude from Growth Chart --    No data found.  Updated Vital Signs BP 127/73 (BP Location: Right Arm)   Pulse 64   Temp 98 F (36.7 C) (Oral)   Resp 15   Ht 6' (1.829 m)   Wt 74.4 kg   SpO2 97%   BMI 22.25 kg/m   Visual Acuity Right Eye Distance:   Left Eye Distance:   Bilateral Distance:    Right Eye Near:   Left Eye Near:    Bilateral Near:     Physical Exam GEN:     alert, non-toxic appearing male in no distress    HENT:  mucus membranes moist, moderate erythematous edematous turbinates, thick  nasal discharge, maxillary sinus tenderness but no frontal sinus tenderness  EYES:   no scleral injection or discharge RESP:  no increased work of breathing Skin:   warm and dry    UC Treatments / Results  Labs (all labs ordered are listed, but only abnormal results are displayed) Labs Reviewed - No data to display  EKG   Radiology No results found.  Procedures Procedures (including critical care time)  Medications Ordered in UC Medications - No data to display  Initial Impression / Assessment and Plan / UC Course  I have reviewed the triage vital signs and the nursing notes.  Pertinent  labs & imaging results that were available during my care of the patient were reviewed by me and considered in my medical decision making (see chart for details).  Pt is a 57 y.o. male who presents for sinus congestion and pain for past 2 weeks. Jester is afebrile without recent use of antipyretics. Satting well on room air. Overall pt is well appearing, well hydrated, without respiratory distress. COVID testing deferred due to duration of symptoms.  On exam, he has erythematous turbinates with thick nasal discharge.  I suspect bacterial rhinosinusitis. Treat with amoxicillin-clavulanic acid twice daily for 7 days and prednisone taper. Rx sent to pharmacy. Discussed typical duration of symptoms. Ensure adequate fluid intake and rest.   Reviewed expectations re: course of current medical issues. Questions answered. Return and ED precautions given.  Patient verbalized understanding. After Visit Summary given.  Discussed MDM, treatment plan and plan for follow-up with patient who agrees with plan.      Final Clinical Impressions(s) / UC Diagnoses   Final diagnoses:  Rhinosinusitis     Discharge Instructions      Stop by the pharmacy to pick up your prescriptions.  Follow up with your primary care provider as needed.      ED Prescriptions     Medication Sig Dispense Auth. Provider   amoxicillin-clavulanate (AUGMENTIN) 875-125 MG tablet Take 1 tablet by mouth every 12 (twelve) hours. 14 tablet Kripa Foskey, DO   predniSONE (STERAPRED UNI-PAK 21 TAB) 10 MG (21) TBPK tablet Take by mouth daily. Take 6 tabs by mouth daily for 1, then 5 tabs for 1 day, then 4 tabs for 1 day, then 3 tabs for 1 day, then 2 tabs for 1 day, then 1 tab for 1 day. 21 tablet Katha Cabal, DO      PDMP not reviewed this encounter.   Katha Cabal, DO 11/14/23 1003

## 2023-11-14 NOTE — Discharge Instructions (Addendum)
Stop by the pharmacy to pick up your prescriptions.  Follow up with your primary care provider as needed.  

## 2023-12-22 ENCOUNTER — Ambulatory Visit: Payer: Medicare (Managed Care) | Admitting: Internal Medicine

## 2023-12-23 ENCOUNTER — Telehealth: Payer: Self-pay | Admitting: Internal Medicine

## 2023-12-23 NOTE — Telephone Encounter (Signed)
Copied from CRM [id number]. Topic: Medicare AWV >> Dec 23, 2023  2:38 PM Payton Doughty wrote: Reason for CRM: Called LVM 12/23/2023 to schedule AWV. Please schedule Virtual or Telehealth visits ONLY.   Verlee Rossetti; Care Guide Ambulatory Clinical Support [place] [facility] Direct Dial: [phone]

## 2024-01-11 ENCOUNTER — Other Ambulatory Visit: Payer: Self-pay | Admitting: Internal Medicine

## 2024-01-11 DIAGNOSIS — E785 Hyperlipidemia, unspecified: Secondary | ICD-10-CM

## 2024-01-12 NOTE — Telephone Encounter (Signed)
 Requested Prescriptions  Pending Prescriptions Disp Refills   atorvastatin  (LIPITOR) 10 MG tablet [Pharmacy Med Name: ATORVASTATIN  10MG  TABLETS] 90 tablet 1    Sig: TAKE 1 TABLET(10 MG) BY MOUTH DAILY     Cardiovascular:  Antilipid - Statins Failed - 01/12/2024  3:51 PM      Failed - Lipid Panel in normal range within the last 12 months    Cholesterol, Total  Date Value Ref Range Status  07/10/2023 141 100 - 199 mg/dL Final   LDL Chol Calc (NIH)  Date Value Ref Range Status  07/10/2023 73 0 - 99 mg/dL Final   HDL  Date Value Ref Range Status  07/10/2023 58 >39 mg/dL Final   Triglycerides  Date Value Ref Range Status  07/10/2023 46 0 - 149 mg/dL Final         Passed - Patient is not pregnant      Passed - Valid encounter within last 12 months    Recent Outpatient Visits           6 months ago Annual physical exam   [place] [facility], Chales [place], MD   1 year ago Annual physical exam   [facility] at [facility], Chales [place], MD   2 years ago Essential (primary) hypertension   [place] [facility] at [facility], Chales [place], MD   2 years ago Annual physical exam   [facility] at [facility], Chales [place], MD   3 years ago Hordeolum internum of left upper eyelid   [place] [facility] at [facility], Chales [place], MD       Future Appointments             In 2 weeks Gala Jubilee, Chales [place], MD [facility] at [facility], [facility]

## 2024-01-27 ENCOUNTER — Ambulatory Visit (INDEPENDENT_AMBULATORY_CARE_PROVIDER_SITE_OTHER): Payer: PRIVATE HEALTH INSURANCE | Admitting: Internal Medicine

## 2024-01-27 ENCOUNTER — Encounter: Payer: Self-pay | Admitting: Internal Medicine

## 2024-01-27 VITALS — BP 122/70 | HR 71 | Ht 72.0 in | Wt 169.0 lb

## 2024-01-27 DIAGNOSIS — I10 Essential (primary) hypertension: Secondary | ICD-10-CM

## 2024-01-27 DIAGNOSIS — N529 Male erectile dysfunction, unspecified: Secondary | ICD-10-CM | POA: Diagnosis not present

## 2024-01-27 DIAGNOSIS — C61 Malignant neoplasm of prostate: Secondary | ICD-10-CM

## 2024-01-27 DIAGNOSIS — Z560 Unemployment, unspecified: Secondary | ICD-10-CM | POA: Diagnosis not present

## 2024-01-27 MED ORDER — SILDENAFIL CITRATE 100 MG PO TABS: 50.0000 mg | ORAL_TABLET | Freq: Every day | ORAL | 1 refills | Status: DC | PRN

## 2024-01-27 MED ORDER — AMLODIPINE BESYLATE 10 MG PO TABS: 10.0000 mg | ORAL_TABLET | Freq: Every day | ORAL | 1 refills | Status: DC

## 2024-01-27 NOTE — Assessment & Plan Note (Signed)
 Follow up at [facility] next month

## 2024-01-27 NOTE — Progress Notes (Signed)
 Date:  01/27/2024   Name:  Nicholas [name]   DOB:  1966-11-26   MRN:  [id number]   Chief Complaint: Hypertension  Hypertension This is a chronic problem. The problem is controlled. Pertinent negatives include no chest pain, headaches, palpitations or shortness of breath. Past treatments include calcium channel blockers. The current treatment provides significant improvement. There are no compliance problems.   Disabled - due to shoulder and back.  He has a form for insurance to cover his premium.  He will answer the questions on a separate paper and return to me to complete and fax. Prostate cancer - he has follow up at [facility] next month.   Review of Systems  Constitutional:  Negative for chills, fatigue and unexpected weight change.  HENT:  Negative for nosebleeds and trouble swallowing.   Eyes:  Negative for visual disturbance.  Respiratory:  Negative for cough, chest tightness, shortness of breath and wheezing.   Cardiovascular:  Negative for chest pain, palpitations and leg swelling.  Gastrointestinal:  Negative for abdominal pain, constipation and diarrhea.  Musculoskeletal:  Positive for arthralgias, back pain, joint swelling and myalgias.  Neurological:  Negative for dizziness, weakness, light-headedness and headaches.  Psychiatric/Behavioral:  Negative for dysphoric mood and sleep disturbance. The patient is not nervous/anxious.      Lab Results  Component Value Date   NA 140 07/10/2023   K 4.6 07/10/2023   CO2 24 07/10/2023   GLUCOSE 97 07/10/2023   BUN 12 07/10/2023   CREATININE 1.36 (H) 07/10/2023   CALCIUM 10.3 (H) 07/10/2023   EGFR 60 07/10/2023   GFRNONAA 77 01/25/2020   Lab Results  Component Value Date   CHOL 141 07/10/2023   HDL 58 07/10/2023   LDLCALC 73 07/10/2023   TRIG 46 07/10/2023   CHOLHDL 2.4 07/10/2023   Lab Results  Component Value Date   TSH 2.150 07/10/2023   No results found for: "HGBA1C" Lab Results  Component Value Date   WBC  5.7 07/10/2023   HGB 14.8 07/10/2023   HCT 42.8 07/10/2023   MCV 90 07/10/2023   PLT 298 07/10/2023   Lab Results  Component Value Date   ALT 20 07/10/2023   AST 28 07/10/2023   ALKPHOS 101 07/10/2023   BILITOT 0.8 07/10/2023   No results found for: "25OHVITD2", "25OHVITD3", "VD25OH"   Patient Active Problem List   Diagnosis Date Noted   Not currently working due to disabled status 01/27/2024   Displacement of lumbar intervertebral disc without myelopathy 12/13/2020   Mild hyperlipidemia 01/22/2019   Leukocytosis 07/14/2017   Chronic right shoulder pain 03/27/2017   Major depression single episode, in partial remission (HCC) 07/18/2016   Generalized anxiety disorder 07/18/2016   ED (erectile dysfunction) 06/17/2015   Essential (primary) hypertension 06/17/2015   Adenocarcinoma of prostate (HCC) 06/17/2015    No Known Allergies  Past Surgical History:  Procedure Laterality Date   COLONOSCOPY WITH PROPOFOL N/A 02/16/2018   Procedure: COLONOSCOPY WITH PROPOFOL;  Surgeon: Midge Minium, MD;  Location: [facility];  Service: Endoscopy;  Laterality: N/A;   SHOULDER OPEN ROTATOR CUFF REPAIR Right 03/2016   shoulder surgery Right     Social History   Tobacco Use   Smoking status: Never   Smokeless tobacco: Never   Tobacco comments:    smoking cessation materials not required  Vaping Use   Vaping status: Never Used  Substance Use Topics   Alcohol use: No   Drug use: No  Medication list has been reviewed and updated.  Current Meds  Medication Sig   atorvastatin (LIPITOR) 10 MG tablet TAKE 1 TABLET(10 MG) BY MOUTH DAILY   clobetasol (TEMOVATE) 0.05 % external solution Apply topically.   cyclobenzaprine (FLEXERIL) 10 MG tablet Take 1 tablet by mouth 2 (two) times daily as needed.   etodolac (LODINE) 400 MG tablet Take 1 tablet by mouth in the morning and at bedtime.   Omega-3 Fatty Acids (FISH OIL) 1000 MG CPDR Take by mouth.   sildenafil (VIAGRA) 100 MG  tablet Take 0.5-1 tablets (50-100 mg total) by mouth daily as needed for erectile dysfunction.   traZODone (DESYREL) 100 MG tablet TAKE 1 TABLET BY MOUTH EVERYDAY AT BEDTIME (Patient taking differently: as needed.)   [DISCONTINUED] amLODipine (NORVASC) 10 MG tablet TAKE 1 TABLET(10 MG) BY MOUTH DAILY   [DISCONTINUED] amoxicillin-clavulanate (AUGMENTIN) 875-125 MG tablet Take 1 tablet by mouth every 12 (twelve) hours.   [DISCONTINUED] predniSONE (STERAPRED UNI-PAK 21 TAB) 10 MG (21) TBPK tablet Take by mouth daily. Take 6 tabs by mouth daily for 1, then 5 tabs for 1 day, then 4 tabs for 1 day, then 3 tabs for 1 day, then 2 tabs for 1 day, then 1 tab for 1 day.   [DISCONTINUED] sildenafil (REVATIO) 20 MG tablet TAKE 1 TABLET(20 MG) BY MOUTH DAILY AS NEEDED   [DISCONTINUED] traZODone (DESYREL) 150 MG tablet Take 150 mg by mouth at bedtime.   [DISCONTINUED] venlafaxine XR (EFFEXOR-XR) 150 MG 24 hr capsule Take 1 capsule (150 mg total) by mouth daily.       01/27/2024    2:46 PM 07/10/2023    9:06 AM 06/06/2022   11:02 AM 10/30/2021    9:18 AM  GAD 7 : Generalized Anxiety Score  Nervous, Anxious, on Edge 0 0 1 2  Control/stop worrying 0 0 1 2  Worry too much - different things 0 0 1 2  Trouble relaxing 0 0 1 2  Restless 0 0 1 1  Easily annoyed or irritable 0 0 1 1  Afraid - awful might happen 0 0 1 1  Total GAD 7 Score 0 0 7 11  Anxiety Difficulty Not difficult at all Not difficult at all Somewhat difficult Not difficult at all       01/27/2024    2:46 PM 07/10/2023    9:06 AM 06/06/2022   11:01 AM  Depression screen PHQ 2/9  Decreased Interest 0 0 1  Down, Depressed, Hopeless 0 0 3  PHQ - 2 Score 0 0 4  Altered sleeping 0 0 2  Tired, decreased energy 0 0 1  Change in appetite 0 0 1  Feeling bad or failure about yourself  0 0 1  Trouble concentrating 0 0 1  Moving slowly or fidgety/restless 0 0 0  Suicidal thoughts 0 0 0  PHQ-9 Score 0 0 10  Difficult doing work/chores Not difficult at  all Not difficult at all Extremely dIfficult    BP Readings from Last 3 Encounters:  01/27/24 122/70  11/14/23 127/73  07/10/23 110/70    Physical Exam Vitals and nursing note reviewed.  Constitutional:      General: He is not in acute distress.    Appearance: Normal appearance. He is well-developed.  HENT:     Head: Normocephalic and atraumatic.  Cardiovascular:     Rate and Rhythm: Normal rate and regular rhythm.  Pulmonary:     Effort: Pulmonary effort is normal. No respiratory distress.  Breath sounds: No wheezing or rhonchi.  Musculoskeletal:     Right shoulder: Bony tenderness present. Decreased range of motion.     Cervical back: Normal range of motion. No rigidity.     Right lower leg: No edema.     Left lower leg: No edema.  Lymphadenopathy:     Cervical: No cervical adenopathy.  Skin:    General: Skin is warm and dry.     Findings: No rash.  Neurological:     Mental Status: He is alert and oriented to person, place, and time.  Psychiatric:        Mood and Affect: Mood normal.        Behavior: Behavior normal.     Wt Readings from Last 3 Encounters:  01/27/24 169 lb (76.7 kg)  11/14/23 164 lb 0.4 oz (74.4 kg)  07/10/23 164 lb (74.4 kg)    BP 122/70   Pulse 71   Ht 6' (1.829 m)   Wt 169 lb (76.7 kg)   SpO2 100%   BMI 22.92 kg/m   Assessment and Plan:  Problem List Items Addressed This Visit       Unprioritized   ED (erectile dysfunction)   Relevant Medications   sildenafil (VIAGRA) 100 MG tablet   Essential (primary) hypertension - Primary (Chronic)   Controlled BP with normal exam. Current regimen is amlodipine. Will continue same medications; encourage continued reduced sodium diet.       Relevant Medications   amLODipine (NORVASC) 10 MG tablet   sildenafil (VIAGRA) 100 MG tablet   Adenocarcinoma of prostate (HCC)   Follow up at [facility] next month      Not currently working due to disabled status    Return in about 6 months  (around 07/26/2024) for CPX.    Reubin Milan, MD [facility] [place]

## 2024-01-27 NOTE — Assessment & Plan Note (Signed)
 Controlled BP with normal exam. Current regimen is amlodipine. Will continue same medications; encourage continued reduced sodium diet.

## 2024-03-24 ENCOUNTER — Ambulatory Visit
Admission: EM | Admit: 2024-03-24 | Discharge: 2024-03-24 | Disposition: A | Discharge status: Home / Self Care | Payer: Medicare (Managed Care) | Attending: Emergency Medicine | Admitting: Emergency Medicine

## 2024-03-24 DIAGNOSIS — B360 Pityriasis versicolor: Secondary | ICD-10-CM | POA: Diagnosis not present

## 2024-03-24 MED ORDER — TERBINAFINE HCL 250 MG PO TABS: 250.0000 mg | ORAL_TABLET | Freq: Every day | ORAL | 0 refills | Status: AC

## 2024-03-24 NOTE — ED Provider Notes (Signed)
 [facility]    CSN: [id number] Arrival date & time: 03/24/24  4098      History   Chief Complaint No chief complaint on file.   HPI Nicholas [name] is a 58 y.o. male.   HPI  58 year old male with past medical history significant for BPH, hypertension, ED, asthma, anxiety presents for evaluation of a rash on the back of his left thigh that is been present for the last 30 days.  He also has a similar rash on the right side of his neck that has been present for the last week.  She states that it only tends to itch at night but throughout the day and does not bother him.  He has been treating it with witch hazel, alcohol, and Neosporin without improvement of symptoms.  He has not had a fever and denies drainage.  Past Medical History:  Diagnosis Date   Anxiety    Asthma    BPH (benign prostatic hyperplasia)    Depression    ED (erectile dysfunction)    HTN (hypertension)    Low back pain    Microscopic hematuria    Nocturia    Special screening for malignant neoplasms, colon    Testicular pain, right 01/19/2018   Urgency of micturation     Patient Active Problem List   Diagnosis Date Noted   Not currently working due to disabled status 01/27/2024   Displacement of lumbar intervertebral disc without myelopathy 12/13/2020   Mild hyperlipidemia 01/22/2019   Leukocytosis 07/14/2017   Chronic right shoulder pain 03/27/2017   Major depression single episode, in partial remission (HCC) 07/18/2016   Generalized anxiety disorder 07/18/2016   ED (erectile dysfunction) 06/17/2015   Essential (primary) hypertension 06/17/2015   Adenocarcinoma of prostate (HCC) 06/17/2015    Past Surgical History:  Procedure Laterality Date   COLONOSCOPY WITH PROPOFOL  N/A 02/16/2018   Procedure: COLONOSCOPY WITH PROPOFOL ;  Surgeon: Marnee Sink, MD;  Location: [facility];  Service: Endoscopy;  Laterality: N/A;   SHOULDER OPEN ROTATOR CUFF REPAIR Right 03/2016   shoulder  surgery Right        Home Medications    Prior to Admission medications   Medication Sig Start Date End Date Taking? Authorizing Provider  terbinafine  (LAMISIL ) 250 MG tablet Take 1 tablet (250 mg total) by mouth daily for 14 days. 03/24/24 04/07/24 Yes Kent Pear, NP  amLODipine  (NORVASC ) 10 MG tablet Take 1 tablet (10 mg total) by mouth daily. 01/27/24   Sheron Dixons, MD  atorvastatin  (LIPITOR) 10 MG tablet TAKE 1 TABLET(10 MG) BY MOUTH DAILY 01/12/24   Berglund, Laura H, MD  clobetasol (TEMOVATE) 0.05 % external solution Apply topically. 01/29/22   [provider]  cyclobenzaprine (FLEXERIL) 10 MG tablet Take 1 tablet by mouth 2 (two) times daily as needed. 09/08/18   [provider]  etodolac (LODINE) 400 MG tablet Take 1 tablet by mouth in the morning and at bedtime.    [provider]  Omega-3 Fatty Acids (FISH OIL) 1000 MG CPDR Take by mouth.    [provider]  sildenafil  (VIAGRA ) 100 MG tablet Take 0.5-1 tablets (50-100 mg total) by mouth daily as needed for erectile dysfunction. 01/27/24   Sheron Dixons, MD  traZODone  (DESYREL ) 100 MG tablet TAKE 1 TABLET BY MOUTH EVERYDAY AT BEDTIME Patient taking differently: as needed. 10/09/18   Sheron Dixons, MD    Family History Family History  Problem Relation Age of Onset   CAD  Mother    COPD Mother    Lung cancer Father    Healthy Sister    Kidney disease Neg Hx    Prostate cancer Neg Hx     Social History Social History   Tobacco Use   Smoking status: Never   Smokeless tobacco: Never   Tobacco comments:    smoking cessation materials not required  Vaping Use   Vaping status: Never Used  Substance Use Topics   Alcohol use: No   Drug use: No     Allergies   Patient has no known allergies.   Review of Systems Review of Systems  Constitutional:  Negative for fever.  Skin:  Positive for color change and rash.     Physical Exam Triage Vital Signs ED Triage Vitals   Encounter Vitals Group     BP      Systolic BP Percentile      Diastolic BP Percentile      Pulse      Resp      Temp      Temp src      SpO2      Weight      Height      Head Circumference      Peak Flow      Pain Score      Pain Loc      Pain Education      Exclude from Growth Chart    No data found.  Updated Vital Signs BP 131/84 (BP Location: Left Arm)   Pulse 65   Temp 97.9 F (36.6 C) (Oral)   Resp 16   SpO2 97%   Visual Acuity Right Eye Distance:   Left Eye Distance:   Bilateral Distance:    Right Eye Near:   Left Eye Near:    Bilateral Near:     Physical Exam Vitals and nursing note reviewed.  Constitutional:      Appearance: Normal appearance. He is not ill-appearing.  HENT:     Head: Normocephalic and atraumatic.  Skin:    General: Skin is warm and dry.     Capillary Refill: Capillary refill takes less than 2 seconds.     Findings: Rash present.  Neurological:     General: No focal deficit present.     Mental Status: He is alert and oriented to person, place, and time.      UC Treatments / Results  Labs (all labs ordered are listed, but only abnormal results are displayed) Labs Reviewed - No data to display  EKG   Radiology No results found.  Procedures Procedures (including critical care time)  Medications Ordered in UC Medications - No data to display  Initial Impression / Assessment and Plan / UC Course  I have reviewed the triage vital signs and the nursing notes.  Pertinent labs & imaging results that were available during my care of the patient were reviewed by me and considered in my medical decision making (see chart for details).   Patient is a pleasant, nontoxic-appearing 58 year old male presenting for evaluation of skin rash as outlined HPI above.    As you can see the images above, the rash consists of hyperpigmented lesions that have some surrounding erythema on the back of the left thigh.  The lesions on the  right side of the neck have a scaly texture.  These are not hot to touch and they are nontender.  I do not suspect cellulitis but I more suspect a  tinea versicolor given the hyperpigmentation.  I will do a trial of Lamisil  250 mg daily for 2 weeks to see if there is any improvement of the rash.  I will also give the patient contact information for [facility] to follow-up with should the Lamisil  not improve or resolve the rash.  Patient had a CMP performed in August 2024 which showed normal liver enzymes.   Final Clinical Impressions(s) / UC Diagnoses   Final diagnoses:  Tinea versicolor     Discharge Instructions      Tinea versicolor is a common fungal skin infection characterized by patches of discolored skin, often lighter or darker than the surrounding skin.  Take the terbinafine  250 mg once daily for 2 weeks for management of the tinea versicolor.  Avoid excessive heat or sweating, and protect skin from sun exposure, as these conditions can exacerbate your symptoms.  The skin discoloration may persist for several weeks or months after successful treatment.  Normal color will usually return.  If your symptoms do not improve, or they worsen, I recommend that you follow-up with dermatology.     ED Prescriptions     Medication Sig Dispense Auth. Provider   terbinafine  (LAMISIL ) 250 MG tablet Take 1 tablet (250 mg total) by mouth daily for 14 days. 14 tablet Kent Pear, NP      PDMP not reviewed this encounter.   Kent Pear, NP 03/24/24 [phone]

## 2024-03-24 NOTE — Discharge Instructions (Addendum)
 Tinea versicolor is a common fungal skin infection characterized by patches of discolored skin, often lighter or darker than the surrounding skin.  Take the terbinafine  250 mg once daily for 2 weeks for management of the tinea versicolor.  Avoid excessive heat or sweating, and protect skin from sun exposure, as these conditions can exacerbate your symptoms.  The skin discoloration may persist for several weeks or months after successful treatment.  Normal color will usually return.  If your symptoms do not improve, or they worsen, I recommend that you follow-up with dermatology.

## 2024-03-24 NOTE — ED Triage Notes (Addendum)
 Patient presents to UC for rash located on left posterior leg x 30 days. States he is unsure if it is insect bite. Has been applying rubbing alcohol with no relief.

## 2024-04-09 ENCOUNTER — Other Ambulatory Visit: Payer: Self-pay | Admitting: Internal Medicine

## 2024-04-09 DIAGNOSIS — E785 Hyperlipidemia, unspecified: Secondary | ICD-10-CM

## 2024-04-12 NOTE — Telephone Encounter (Signed)
 Requested Prescriptions  Refused Prescriptions Disp Refills   atorvastatin  (LIPITOR) 10 MG tablet [Pharmacy Med Name: ATORVASTATIN  10MG  TABLETS] 90 tablet 1    Sig: TAKE 1 TABLET(10 MG) BY MOUTH DAILY     Cardiovascular:  Antilipid - Statins Failed - 04/12/2024  1:00 PM      Failed - Lipid Panel in normal range within the last 12 months    Cholesterol, Total  Date Value Ref Range Status  07/10/2023 141 100 - 199 mg/dL Final   LDL Chol Calc (NIH)  Date Value Ref Range Status  07/10/2023 73 0 - 99 mg/dL Final   HDL  Date Value Ref Range Status  07/10/2023 58 >39 mg/dL Final   Triglycerides  Date Value Ref Range Status  07/10/2023 46 0 - 149 mg/dL Final         Passed - Patient is not pregnant      Passed - Valid encounter within last 12 months    Recent Outpatient Visits           2 months ago Essential (primary) hypertension   [place] [facility] at [facility], Chales [place], MD       Future Appointments             In 3 months Gala Jubilee, Chales [place], MD [facility] at [facility], [facility]

## 2024-05-04 ENCOUNTER — Telehealth: Payer: Self-pay | Admitting: Internal Medicine

## 2024-05-04 NOTE — Telephone Encounter (Signed)
 Copied from CRM [id number]. Topic: Medicare AWV >> May 04, 2024  1:48 PM Juliana Ocean wrote: Reason for CRM: LVM 05/05/2024 to schedule AWV. Please schedule Virtual or Telehealth visits ONLY.   Rosalee Collins; Care Guide Ambulatory Clinical Support [place] [facility] Direct Dial: [phone]

## 2024-05-29 ENCOUNTER — Other Ambulatory Visit: Payer: Self-pay | Admitting: Internal Medicine

## 2024-05-29 DIAGNOSIS — I10 Essential (primary) hypertension: Secondary | ICD-10-CM

## 2024-05-31 NOTE — Telephone Encounter (Signed)
 Requested Prescriptions  Pending Prescriptions Disp Refills   amLODipine  (NORVASC ) 10 MG tablet [Pharmacy Med Name: AMLODIPINE  BESYLATE 10MG TABLETS] 90 tablet 1    Sig: TAKE 1 TABLET(10 MG) BY MOUTH DAILY     Cardiovascular: Calcium  Channel Blockers 2 Passed - 05/31/2024  5:38 PM      Passed - Last BP in normal range    BP Readings from Last 1 Encounters:  03/24/24 131/84         Passed - Last Heart Rate in normal range    Pulse Readings from Last 1 Encounters:  03/24/24 65         Passed - Valid encounter within last 6 months    Recent Outpatient Visits           4 months ago Essential (primary) hypertension   [place] [facility] at [facility], Leita DEL, MD       Future Appointments             In 1 month Justus, Leita DEL, MD [facility] at [facility], [facility]

## 2024-07-07 ENCOUNTER — Telehealth: Payer: Self-pay | Admitting: Internal Medicine

## 2024-07-07 NOTE — Telephone Encounter (Signed)
 Copied from CRM #[id number]. Topic: Medicare AWV >> Jul 07, 2024 12:38 PM Nathanel DEL wrote: Reason for CRM: Called 07/07/2024 to sched AWV - Line Busy   Nathanel Paschal; Care Guide Ambulatory Clinical Support [place] [facility] Direct Dial: [phone]

## 2024-07-24 ENCOUNTER — Other Ambulatory Visit: Payer: Self-pay | Admitting: Internal Medicine

## 2024-07-24 DIAGNOSIS — E785 Hyperlipidemia, unspecified: Secondary | ICD-10-CM

## 2024-07-26 ENCOUNTER — Encounter: Payer: PRIVATE HEALTH INSURANCE | Admitting: Internal Medicine

## 2024-07-26 ENCOUNTER — Other Ambulatory Visit: Payer: Self-pay

## 2024-07-26 NOTE — Telephone Encounter (Signed)
 Requested medications are due for refill today.  yes  Requested medications are on the active medications list.  yes  Last refill. 01/12/2024 #90 1 rf  Future visit scheduled.   yes  Notes to clinic.  Labs are expired.    Requested Prescriptions  Pending Prescriptions Disp Refills   atorvastatin  (LIPITOR) 10 MG tablet [Pharmacy Med Name: ATORVASTATIN  10MG  TABLETS] 90 tablet 1    Sig: TAKE 1 TABLET(10 MG) BY MOUTH DAILY     Cardiovascular:  Antilipid - Statins Failed - 07/26/2024  2:05 PM      Failed - Lipid Panel in normal range within the last 12 months    Cholesterol, Total  Date Value Ref Range Status  07/10/2023 141 100 - 199 mg/dL Final   LDL Chol Calc (NIH)  Date Value Ref Range Status  07/10/2023 73 0 - 99 mg/dL Final   HDL  Date Value Ref Range Status  07/10/2023 58 >39 mg/dL Final   Triglycerides  Date Value Ref Range Status  07/10/2023 46 0 - 149 mg/dL Final         Passed - Patient is not pregnant      Passed - Valid encounter within last 12 months    Recent Outpatient Visits           6 months ago Essential (primary) hypertension   [place] [facility] at [facility], Leita DEL, MD       Future Appointments             In 2 weeks Justus, Leita DEL, MD [facility] Medicine at [facility], [facility]

## 2024-07-26 NOTE — Progress Notes (Deleted)
 Date:  07/26/2024   Name:  Nicholas [name]   DOB:  01-31-1966   MRN:  [id number]   Chief Complaint: No chief complaint on file. Nicholas [name] is a 58 y.o. male who presents today for his Complete Annual Exam. He feels {DESC; WELL/FAIRLY WELL/POORLY:18703}. He reports exercising ***. He reports he is sleeping {DESC; WELL/FAIRLY WELL/POORLY:18703}.   Health Maintenance  Topic Date Due   Hepatitis B Vaccine (1 of 3 - 19+ 3-dose series) Never done   Pneumococcal Vaccine for age over 41 (1 of 1 - PCV) Never done   Zoster (Shingles) Vaccine (2 of 2) 10/26/2021   Medicare Annual Wellness Visit  12/24/2022   Flu Shot  03/01/2025*   Colon Cancer Screening  02/17/2028   Hepatitis C Screening  Completed   HIV Screening  Completed   HPV Vaccine  Aged Out   Meningitis B Vaccine  Aged Out   DTaP/Tdap/Td vaccine  Discontinued   COVID-19 Vaccine  Discontinued  *Topic was postponed. The date shown is not the original due date.    Lab Results  Component Value Date   PSA1 5.9 (H) 07/10/2023   PSA1 6.6 (H) 06/22/2021   PSA1 7.7 (H) 05/28/2021   PSA 2.5 02/15/2013    Hypertension This is a chronic problem. The problem is controlled. Pertinent negatives include no chest pain, headaches, palpitations or shortness of breath. Past treatments include calcium  channel blockers. The current treatment provides significant improvement. There is no history of kidney disease, CAD/MI or CVA.  Hyperlipidemia This is a chronic problem. The problem is controlled. Pertinent negatives include no chest pain or shortness of breath. Current antihyperlipidemic treatment includes statins.    Review of Systems  Constitutional:  Negative for fatigue and unexpected weight change.  HENT:  Negative for nosebleeds.   Eyes:  Negative for visual disturbance.  Respiratory:  Negative for cough, chest tightness, shortness of breath and wheezing.   Cardiovascular:  Negative for chest pain, palpitations and leg  swelling.  Gastrointestinal:  Negative for abdominal pain, constipation and diarrhea.  Neurological:  Negative for dizziness, weakness, light-headedness and headaches.     Lab Results  Component Value Date   NA 140 07/10/2023   K 4.6 07/10/2023   CO2 24 07/10/2023   GLUCOSE 97 07/10/2023   BUN 12 07/10/2023   CREATININE 1.36 (H) 07/10/2023   CALCIUM  10.3 (H) 07/10/2023   EGFR 60 07/10/2023   GFRNONAA 77 01/25/2020   Lab Results  Component Value Date   CHOL 141 07/10/2023   HDL 58 07/10/2023   LDLCALC 73 07/10/2023   TRIG 46 07/10/2023   CHOLHDL 2.4 07/10/2023   Lab Results  Component Value Date   TSH 2.150 07/10/2023   No results found for: HGBA1C Lab Results  Component Value Date   WBC 5.7 07/10/2023   HGB 14.8 07/10/2023   HCT 42.8 07/10/2023   MCV 90 07/10/2023   PLT 298 07/10/2023   Lab Results  Component Value Date   ALT 20 07/10/2023   AST 28 07/10/2023   ALKPHOS 101 07/10/2023   BILITOT 0.8 07/10/2023   No results found for: MARIEN BOLLS, VD25OH   Patient Active Problem List   Diagnosis Date Noted   Not currently working due to disabled status 01/27/2024   Low back pain 08/29/2022   Displacement of lumbar intervertebral disc without myelopathy 12/13/2020   Mild hyperlipidemia 01/22/2019   Leukocytosis 07/14/2017   Chronic right shoulder pain 03/27/2017   Major depression  single episode, in partial remission (HCC) 07/18/2016   Generalized anxiety disorder 07/18/2016   ED (erectile dysfunction) 06/17/2015   Essential (primary) hypertension 06/17/2015   Adenocarcinoma of prostate (HCC) 06/17/2015    No Known Allergies  Past Surgical History:  Procedure Laterality Date   COLONOSCOPY WITH PROPOFOL  N/A 02/16/2018   Procedure: COLONOSCOPY WITH PROPOFOL ;  Surgeon: Jinny Carmine, MD;  Location: [facility];  Service: Endoscopy;  Laterality: N/A;   SHOULDER OPEN ROTATOR CUFF REPAIR Right 03/2016   shoulder surgery Right      Social History   Tobacco Use   Smoking status: Never   Smokeless tobacco: Never   Tobacco comments:    smoking cessation materials not required  Vaping Use   Vaping status: Never Used  Substance Use Topics   Alcohol use: No   Drug use: No     Medication list has been reviewed and updated.  No outpatient medications have been marked as taking for the 07/26/24 encounter (Appointment) with Justus Leita DEL, MD.       01/27/2024    2:46 PM 07/10/2023    9:06 AM 06/06/2022   11:02 AM 10/30/2021    9:18 AM  GAD 7 : Generalized Anxiety Score  Nervous, Anxious, on Edge 0 0 1 2  Control/stop worrying 0 0 1 2  Worry too much - different things 0 0 1 2  Trouble relaxing 0 0 1 2  Restless 0 0 1 1  Easily annoyed or irritable 0 0 1 1  Afraid - awful might happen 0 0 1 1  Total GAD 7 Score 0 0 7 11  Anxiety Difficulty Not difficult at all Not difficult at all Somewhat difficult Not difficult at all       01/27/2024    2:46 PM 07/10/2023    9:06 AM 06/06/2022   11:01 AM  Depression screen PHQ 2/9  Decreased Interest 0 0 1  Down, Depressed, Hopeless 0 0 3  PHQ - 2 Score 0 0 4  Altered sleeping 0 0 2  Tired, decreased energy 0 0 1  Change in appetite 0 0 1  Feeling bad or failure about yourself  0 0 1  Trouble concentrating 0 0 1  Moving slowly or fidgety/restless 0 0 0  Suicidal thoughts 0 0 0  PHQ-9 Score 0 0 10  Difficult doing work/chores Not difficult at all Not difficult at all Extremely dIfficult    BP Readings from Last 3 Encounters:  03/24/24 131/84  01/27/24 122/70  11/14/23 127/73    Physical Exam Vitals and nursing note reviewed.  Constitutional:      Appearance: Normal appearance. He is well-developed.  HENT:     Head: Normocephalic.     Right Ear: Tympanic membrane, ear canal and external ear normal.     Left Ear: Tympanic membrane, ear canal and external ear normal.     Nose: Nose normal.  Eyes:     Conjunctiva/sclera: Conjunctivae normal.      Pupils: Pupils are equal, round, and reactive to light.  Neck:     Thyroid : No thyromegaly.     Vascular: No carotid bruit.  Cardiovascular:     Rate and Rhythm: Normal rate and regular rhythm.     Heart sounds: Normal heart sounds.  Pulmonary:     Effort: Pulmonary effort is normal.     Breath sounds: Normal breath sounds. No wheezing.  Chest:  Breasts:    Right: No mass.     Left:  No mass.  Abdominal:     General: Bowel sounds are normal.     Palpations: Abdomen is soft.     Tenderness: There is no abdominal tenderness.  Musculoskeletal:        General: Normal range of motion.     Cervical back: Normal range of motion and neck supple.  Lymphadenopathy:     Cervical: No cervical adenopathy.  Skin:    General: Skin is warm and dry.  Neurological:     Mental Status: He is alert and oriented to person, place, and time.     Deep Tendon Reflexes: Reflexes are normal and symmetric.  Psychiatric:        Attention and Perception: Attention normal.        Mood and Affect: Mood normal.        Thought Content: Thought content normal.     Wt Readings from Last 3 Encounters:  01/27/24 169 lb (76.7 kg)  11/14/23 164 lb 0.4 oz (74.4 kg)  07/10/23 164 lb (74.4 kg)    There were no vitals taken for this visit.  Assessment and Plan:  Problem List Items Addressed This Visit       Unprioritized   Essential (primary) hypertension (Chronic)   Mild hyperlipidemia (Chronic)   Adenocarcinoma of prostate (HCC)   Other Visit Diagnoses       Annual physical exam    -  Primary       No follow-ups on file.    Leita HILARIO Adie, MD [facility] [place]

## 2024-08-11 ENCOUNTER — Encounter: Payer: PRIVATE HEALTH INSURANCE | Admitting: Internal Medicine

## 2024-09-08 ENCOUNTER — Ambulatory Visit (INDEPENDENT_AMBULATORY_CARE_PROVIDER_SITE_OTHER): Payer: Medicare (Managed Care) | Admitting: Internal Medicine

## 2024-09-08 ENCOUNTER — Encounter: Payer: Self-pay | Admitting: Internal Medicine

## 2024-09-08 VITALS — BP 128/72 | HR 60 | Ht 72.0 in | Wt 161.0 lb

## 2024-09-08 DIAGNOSIS — M5126 Other intervertebral disc displacement, lumbar region: Secondary | ICD-10-CM | POA: Diagnosis not present

## 2024-09-08 DIAGNOSIS — F325 Major depressive disorder, single episode, in full remission: Secondary | ICD-10-CM

## 2024-09-08 DIAGNOSIS — E785 Hyperlipidemia, unspecified: Secondary | ICD-10-CM

## 2024-09-08 DIAGNOSIS — J3089 Other allergic rhinitis: Secondary | ICD-10-CM | POA: Diagnosis not present

## 2024-09-08 DIAGNOSIS — G8929 Other chronic pain: Secondary | ICD-10-CM

## 2024-09-08 DIAGNOSIS — M25511 Pain in right shoulder: Secondary | ICD-10-CM | POA: Diagnosis not present

## 2024-09-08 DIAGNOSIS — I10 Essential (primary) hypertension: Secondary | ICD-10-CM | POA: Diagnosis not present

## 2024-09-08 DIAGNOSIS — N529 Male erectile dysfunction, unspecified: Secondary | ICD-10-CM | POA: Diagnosis not present

## 2024-09-08 DIAGNOSIS — C61 Malignant neoplasm of prostate: Secondary | ICD-10-CM | POA: Diagnosis not present

## 2024-09-08 DIAGNOSIS — Z0000 Encounter for general adult medical examination without abnormal findings: Secondary | ICD-10-CM

## 2024-09-08 MED ORDER — AMLODIPINE BESYLATE 10 MG PO TABS: 10.0000 mg | ORAL_TABLET | Freq: Every day | ORAL | 1 refills | Status: DC

## 2024-09-08 MED ORDER — SILDENAFIL CITRATE 100 MG PO TABS
50.0000 mg | ORAL_TABLET | Freq: Every day | ORAL | 1 refills | Status: AC | PRN
Start: 2024-09-08 — End: ?

## 2024-09-08 MED ORDER — ATORVASTATIN CALCIUM 10 MG PO TABS: ORAL_TABLET | ORAL | 1 refills | Status: AC

## 2024-09-08 NOTE — Assessment & Plan Note (Addendum)
 Ongoing daily pain and limited ROM despite surgical repair and prolonged PT Takes nsaids as needed Needs disability paperwork completed

## 2024-09-08 NOTE — Assessment & Plan Note (Addendum)
 Being followed at [facility].  Unclear date of last visit - in March they were trying to get an MRI approved. He has not been back in some time - urged to schedule follow up. Will obtain PSA today.

## 2024-09-08 NOTE — Assessment & Plan Note (Signed)
 LDL is  Lab Results  Component Value Date   LDLCALC 73 07/10/2023    Current medication regimen is atorvastatin . Goal LDL is < 100.

## 2024-09-08 NOTE — Progress Notes (Signed)
 Date:  09/08/2024   Name:  Nicholas [name]   DOB:  [birth year]   MRN:  [id number]   Chief Complaint: Annual Exam Nicholas [name] is a 58 y.o. male who presents today for his Complete Annual Exam. He feels well. He reports exercising. He reports he is sleeping well.  Health Maintenance  Topic Date Due   Hepatitis B Vaccine (1 of 3 - 19+ 3-dose series) Never done   Medicare Annual Wellness Visit  12/24/2022   Zoster (Shingles) Vaccine (2 of 2) 12/09/2024*   Flu Shot  03/01/2025*   Pneumococcal Vaccine for age over 35 (1 of 1 - PCV) 09/08/2025*   Colon Cancer Screening  02/17/2028   Hepatitis C Screening  Completed   HIV Screening  Completed   HPV Vaccine  Aged Out   Meningitis B Vaccine  Aged Out   DTaP/Tdap/Td vaccine  Discontinued   COVID-19 Vaccine  Discontinued  *Topic was postponed. The date shown is not the original due date.   Lab Results  Component Value Date   PSA1 5.9 (H) 07/10/2023   PSA1 6.6 (H) 06/22/2021   PSA1 7.7 (H) 05/28/2021   PSA 2.5 02/15/2013    Hypertension This is a chronic problem. The problem is controlled. Pertinent negatives include no chest pain, headaches, palpitations or shortness of breath. Past treatments include calcium  channel blockers. The current treatment provides significant improvement. There is no history of kidney disease, CAD/MI or CVA.  Hyperlipidemia This is a chronic problem. The problem is controlled. Pertinent negatives include no chest pain or shortness of breath. Current antihyperlipidemic treatment includes statins. The current treatment provides significant improvement of lipids.    Review of Systems  Constitutional:  Negative for fatigue and unexpected weight change.  HENT:  Negative for nosebleeds.   Eyes:  Negative for visual disturbance.  Respiratory:  Negative for cough, chest tightness, shortness of breath and wheezing.   Cardiovascular:  Negative for chest pain, palpitations and leg swelling.   Gastrointestinal:  Negative for abdominal pain, constipation and diarrhea.  Genitourinary:  Negative for difficulty urinating.  Musculoskeletal:  Positive for arthralgias (right shoulder pain, decr ROM) and back pain (chronic low back pain).  Neurological:  Negative for dizziness, weakness, light-headedness and headaches.  Psychiatric/Behavioral:  Negative for dysphoric mood and sleep disturbance. The patient is not nervous/anxious.      Lab Results  Component Value Date   NA 140 07/10/2023   K 4.6 07/10/2023   CO2 24 07/10/2023   GLUCOSE 97 07/10/2023   BUN 12 07/10/2023   CREATININE 1.36 (H) 07/10/2023   CALCIUM  10.3 (H) 07/10/2023   EGFR 60 07/10/2023   GFRNONAA 77 01/25/2020   Lab Results  Component Value Date   CHOL 141 07/10/2023   HDL 58 07/10/2023   LDLCALC 73 07/10/2023   TRIG 46 07/10/2023   CHOLHDL 2.4 07/10/2023   Lab Results  Component Value Date   TSH 2.150 07/10/2023   No results found for: HGBA1C Lab Results  Component Value Date   WBC 5.7 07/10/2023   HGB 14.8 07/10/2023   HCT 42.8 07/10/2023   MCV 90 07/10/2023   PLT 298 07/10/2023   Lab Results  Component Value Date   ALT 20 07/10/2023   AST 28 07/10/2023   ALKPHOS 101 07/10/2023   BILITOT 0.8 07/10/2023   No results found for: 25OHVITD2, 25OHVITD3, VD25OH   Patient Active Problem List   Diagnosis Date Noted   Not currently working due to disabled  status 01/27/2024   Low back pain 08/29/2022   Displacement of lumbar intervertebral disc without myelopathy 12/13/2020   Mild hyperlipidemia 01/22/2019   Leukocytosis 07/14/2017   Chronic right shoulder pain 03/27/2017   Major depression, single episode, in complete remission 07/18/2016   Generalized anxiety disorder 07/18/2016   ED (erectile dysfunction) 06/17/2015   Essential (primary) hypertension 06/17/2015   Adenocarcinoma of prostate (HCC) 06/17/2015    No Known Allergies  Past Surgical History:  Procedure Laterality Date    COLONOSCOPY WITH PROPOFOL  N/A 02/16/2018   Procedure: COLONOSCOPY WITH PROPOFOL ;  Surgeon: Jinny Carmine, MD;  Location: [facility];  Service: Endoscopy;  Laterality: N/A;   SHOULDER OPEN ROTATOR CUFF REPAIR Right 03/2016   shoulder surgery Right     Social History   Tobacco Use   Smoking status: Never   Smokeless tobacco: Never   Tobacco comments:    smoking cessation materials not required  Vaping Use   Vaping status: Never Used  Substance Use Topics   Alcohol use: No   Drug use: No     Medication list has been reviewed and updated.  Current Meds  Medication Sig   clobetasol (TEMOVATE) 0.05 % external solution Apply topically.   cyclobenzaprine (FLEXERIL) 10 MG tablet Take 1 tablet by mouth 2 (two) times daily as needed.   etodolac (LODINE) 400 MG tablet Take 1 tablet by mouth in the morning and at bedtime.   Omega-3 Fatty Acids (FISH OIL) 1000 MG CPDR Take by mouth.   [DISCONTINUED] amLODipine  (NORVASC ) 10 MG tablet TAKE 1 TABLET(10 MG) BY MOUTH DAILY   [DISCONTINUED] atorvastatin  (LIPITOR) 10 MG tablet TAKE 1 TABLET(10 MG) BY MOUTH DAILY   [DISCONTINUED] sildenafil  (VIAGRA ) 100 MG tablet Take 0.5-1 tablets (50-100 mg total) by mouth daily as needed for erectile dysfunction.   [DISCONTINUED] traZODone  (DESYREL ) 100 MG tablet TAKE 1 TABLET BY MOUTH EVERYDAY AT BEDTIME (Patient taking differently: as needed.)       09/08/2024   10:49 AM 01/27/2024    2:46 PM 07/10/2023    9:06 AM 06/06/2022   11:02 AM  GAD 7 : Generalized Anxiety Score  Nervous, Anxious, on Edge 0 0 0 1  Control/stop worrying 0 0 0 1  Worry too much - different things 0 0 0 1  Trouble relaxing 0 0 0 1  Restless 0 0 0 1  Easily annoyed or irritable 0 0 0 1  Afraid - awful might happen 0 0 0 1  Total GAD 7 Score 0 0 0 7  Anxiety Difficulty Not difficult at all Not difficult at all Not difficult at all Somewhat difficult       09/08/2024   10:49 AM 01/27/2024    2:46 PM 07/10/2023    9:06 AM   Depression screen PHQ 2/9  Decreased Interest 0 0 0  Down, Depressed, Hopeless 0 0 0  PHQ - 2 Score 0 0 0  Altered sleeping 0 0 0  Tired, decreased energy 0 0 0  Change in appetite 0 0 0  Feeling bad or failure about yourself  0 0 0  Trouble concentrating 0 0 0  Moving slowly or fidgety/restless 0 0 0  Suicidal thoughts 0 0 0  PHQ-9 Score 0 0 0  Difficult doing work/chores Not difficult at all Not difficult at all Not difficult at all    BP Readings from Last 3 Encounters:  09/08/24 128/72  03/24/24 131/84  01/27/24 122/70    Physical Exam Vitals and nursing  note reviewed.  Constitutional:      Appearance: Normal appearance. He is well-developed.  HENT:     Head: Normocephalic.     Right Ear: Tympanic membrane, ear canal and external ear normal.     Left Ear: Tympanic membrane, ear canal and external ear normal.     Nose: Nose normal.  Eyes:     Conjunctiva/sclera: Conjunctivae normal.     Pupils: Pupils are equal, round, and reactive to light.  Neck:     Thyroid : No thyromegaly.     Vascular: No carotid bruit.  Cardiovascular:     Rate and Rhythm: Normal rate and regular rhythm.     Heart sounds: Normal heart sounds.  Pulmonary:     Effort: Pulmonary effort is normal.     Breath sounds: Normal breath sounds. No wheezing.  Chest:  Breasts:    Right: No mass.     Left: No mass.  Abdominal:     General: Bowel sounds are normal.     Palpations: Abdomen is soft.     Tenderness: There is no abdominal tenderness.  Musculoskeletal:     Right shoulder: Bony tenderness present. No swelling or deformity. Decreased range of motion.     Left shoulder: Normal.     Cervical back: Normal range of motion and neck supple.     Lumbar back: Spasms and tenderness present. Negative right straight leg raise test and negative left straight leg raise test.  Lymphadenopathy:     Cervical: No cervical adenopathy.  Skin:    General: Skin is warm and dry.     Capillary Refill:  Capillary refill takes less than 2 seconds.  Neurological:     General: No focal deficit present.     Mental Status: He is alert and oriented to person, place, and time.     Deep Tendon Reflexes: Reflexes are normal and symmetric.  Psychiatric:        Attention and Perception: Attention normal.        Mood and Affect: Mood normal.        Thought Content: Thought content normal.     Wt Readings from Last 3 Encounters:  09/08/24 161 lb (73 kg)  01/27/24 169 lb (76.7 kg)  11/14/23 164 lb 0.4 oz (74.4 kg)    BP 128/72   Pulse 60   Ht 6' (1.829 m)   Wt 161 lb (73 kg)   SpO2 97%   BMI 21.84 kg/m   Assessment and Plan:  Problem List Items Addressed This Visit       Unprioritized   Adenocarcinoma of prostate (HCC)   Being followed at [facility]  Unclear date of last visit - in March they were trying to get an MRI approved. He has not been back in some time - urged to schedule follow up. Will obtain PSA today.      Relevant Orders   PSA   Chronic right shoulder pain   Ongoing daily pain and limited ROM despite surgical repair and prolonged PT Takes nsaids as needed Needs disability paperwork completed      Displacement of lumbar intervertebral disc without myelopathy   Chronic low back pain contributing to disability He takes flexeril and Lodine PRN      ED (erectile dysfunction)   Relevant Medications   sildenafil  (VIAGRA ) 100 MG tablet   Essential (primary) hypertension (Chronic)   Well controlled blood pressure today. Current regimen is amlodipine . No medication side effects noted.  Relevant Medications   atorvastatin  (LIPITOR) 10 MG tablet   amLODipine  (NORVASC ) 10 MG tablet   sildenafil  (VIAGRA ) 100 MG tablet   Other Relevant Orders   CBC with Differential/Platelet   Comprehensive metabolic panel with GFR   TSH   Urinalysis, Routine w reflex microscopic   Major depression, single episode, in complete remission   Not currently on medication and doing  well.      Mild hyperlipidemia (Chronic)   LDL is  Lab Results  Component Value Date   LDLCALC 73 07/10/2023    Current medication regimen is atorvastatin . Goal LDL is < 100.       Relevant Medications   atorvastatin  (LIPITOR) 10 MG tablet   amLODipine  (NORVASC ) 10 MG tablet   sildenafil  (VIAGRA ) 100 MG tablet   Other Relevant Orders   Lipid panel   Other Visit Diagnoses       Annual physical exam    -  Primary   up to date on screenings he declines immunizations     Environmental and seasonal allergies       recommend trial of Flonase spray       Return in about 6 months (around 03/09/2025) for [facility] HTN  Dr. Lemon.    Leita HILARIO Adie, MD [facility] [place]

## 2024-09-08 NOTE — Assessment & Plan Note (Signed)
 Not currently on medication and doing well.

## 2024-09-08 NOTE — Patient Instructions (Signed)
 Fluticasone (flonase) nasal spray - use it once a day

## 2024-09-08 NOTE — Assessment & Plan Note (Signed)
 Well controlled blood pressure today. Current regimen is amlodipine . No medication side effects noted.

## 2024-09-08 NOTE — Assessment & Plan Note (Signed)
 Chronic low back pain contributing to disability He takes flexeril and Lodine PRN

## 2024-09-09 ENCOUNTER — Ambulatory Visit: Payer: Self-pay | Admitting: Internal Medicine

## 2024-09-09 LAB — COMPREHENSIVE METABOLIC PANEL WITH GFR
ALT: 16 IU/L (ref 0–44)
AST: 24 IU/L (ref 0–40)
Albumin: 4.4 g/dL (ref 3.8–4.9)
Alkaline Phosphatase: 114 IU/L (ref 47–123)
BUN/Creatinine Ratio: 11 (ref 9–20)
BUN: 12 mg/dL (ref 6–24)
Bilirubin Total: 0.9 mg/dL (ref 0.0–1.2)
CO2: 21 mmol/L (ref 20–29)
Calcium: 9.7 mg/dL (ref 8.7–10.2)
Chloride: 105 mmol/L (ref 96–106)
Creatinine, Ser: 1.09 mg/dL (ref 0.76–1.27)
Globulin, Total: 2.9 g/dL (ref 1.5–4.5)
Glucose: 62 mg/dL — ABNORMAL LOW (ref 70–99)
Potassium: 4.7 mmol/L (ref 3.5–5.2)
Sodium: 143 mmol/L (ref 134–144)
Total Protein: 7.3 g/dL (ref 6.0–8.5)
eGFR: 78 mL/min/1.73 (ref >59)

## 2024-09-09 LAB — CBC WITH DIFFERENTIAL/PLATELET
Basophils Absolute: 0.1 x10E3/uL (ref 0.0–0.2)
Basos: 1 %
EOS (ABSOLUTE): 0.4 x10E3/uL (ref 0.0–0.4)
Eos: 7 %
Hematocrit: 45.8 % (ref 37.5–51.0)
Hemoglobin: 14.3 g/dL (ref 13.0–17.7)
Immature Grans (Abs): 0 x10E3/uL (ref 0.0–0.1)
Immature Granulocytes: 0 %
Lymphocytes Absolute: 1.6 x10E3/uL (ref 0.7–3.1)
Lymphs: 27 %
MCH: 29.7 pg (ref 26.6–33.0)
MCHC: 31.2 g/dL — ABNORMAL LOW (ref 31.5–35.7)
MCV: 95 fL (ref 79–97)
Monocytes Absolute: 0.5 x10E3/uL (ref 0.1–0.9)
Monocytes: 9 %
Neutrophils Absolute: 3.5 x10E3/uL (ref 1.4–7.0)
Neutrophils: 56 %
Platelets: 240 x10E3/uL (ref 150–450)
RBC: 4.82 x10E6/uL (ref 4.14–5.80)
RDW: 11.3 % — ABNORMAL LOW (ref 11.6–15.4)
WBC: 6.1 x10E3/uL (ref 3.4–10.8)

## 2024-09-09 LAB — URINALYSIS, ROUTINE W REFLEX MICROSCOPIC
Bilirubin, UA: NEGATIVE
Glucose, UA: NEGATIVE
Ketones, UA: NEGATIVE
Nitrite, UA: NEGATIVE
Protein,UA: NEGATIVE
RBC, UA: NEGATIVE
Specific Gravity, UA: 1.009 (ref 1.005–1.030)
Urobilinogen, Ur: 0.2 mg/dL (ref 0.2–1.0)
pH, UA: 6.5 (ref 5.0–7.5)

## 2024-09-09 LAB — TSH: TSH: 1.62 u[IU]/mL (ref 0.450–4.500)

## 2024-09-09 LAB — MICROSCOPIC EXAMINATION
Bacteria, UA: NONE SEEN
Casts: NONE SEEN /LPF
RBC, Urine: NONE SEEN /HPF (ref 0–2)
WBC, UA: NONE SEEN /HPF (ref 0–5)

## 2024-09-09 LAB — LIPID PANEL
Chol/HDL Ratio: 2.6 ratio (ref 0.0–5.0)
Cholesterol, Total: 132 mg/dL (ref 100–199)
HDL: 51 mg/dL (ref >39)
LDL Chol Calc (NIH): 66 mg/dL (ref 0–99)
Triglycerides: 73 mg/dL (ref 0–149)
VLDL Cholesterol Cal: 15 mg/dL (ref 5–40)

## 2024-09-09 LAB — PSA: Prostate Specific Ag, Serum: 8 ng/mL — ABNORMAL HIGH (ref 0.0–4.0)

## 2024-10-11 ENCOUNTER — Telehealth: Payer: Self-pay | Admitting: Internal Medicine

## 2024-10-11 NOTE — Telephone Encounter (Unsigned)
 Copied from CRM [id number]. Topic: Referral - Request for Referral >> Oct 11, 2024 12:08 PM Donee H wrote: Did the patient discuss referral with their provider in the last year? Yes  Appointment offered? No  Type of order/referral and detailed reason for visit: Pain management for lower back pain  Preference of office, provider, location:  [facility]: [facility] [address], [place], [place] [address] [phone]    If referral order, have you been seen by this specialty before? Yes, but it's been over a year   Can we respond through MyChart? Yes

## 2024-10-13 DIAGNOSIS — R972 Elevated prostate specific antigen [PSA]: Secondary | ICD-10-CM | POA: Diagnosis not present

## 2024-10-13 DIAGNOSIS — C61 Malignant neoplasm of prostate: Secondary | ICD-10-CM | POA: Diagnosis not present

## 2024-10-13 DIAGNOSIS — N429 Disorder of prostate, unspecified: Secondary | ICD-10-CM | POA: Diagnosis not present

## 2024-10-13 DIAGNOSIS — N4289 Other specified disorders of prostate: Secondary | ICD-10-CM | POA: Diagnosis not present

## 2024-10-13 DIAGNOSIS — Z9889 Other specified postprocedural states: Secondary | ICD-10-CM | POA: Diagnosis not present

## 2024-12-04 ENCOUNTER — Other Ambulatory Visit: Payer: Self-pay | Admitting: Internal Medicine

## 2024-12-04 DIAGNOSIS — I10 Essential (primary) hypertension: Secondary | ICD-10-CM

## 2024-12-06 NOTE — Telephone Encounter (Signed)
 Requested Prescriptions  Pending Prescriptions Disp Refills   amLODipine  (NORVASC ) 10 MG tablet [Pharmacy Med Name: AMLODIPINE  BESYLATE 10MG TABLETS] 90 tablet 0    Sig: TAKE 1 TABLET(10 MG) BY MOUTH DAILY     Cardiovascular: Calcium  Channel Blockers 2 Passed - 12/06/2024  3:43 PM      Passed - Last BP in normal range    BP Readings from Last 1 Encounters:  09/08/24 128/72         Passed - Last Heart Rate in normal range    Pulse Readings from Last 1 Encounters:  09/08/24 60         Passed - Valid encounter within last 6 months    Recent Outpatient Visits           2 months ago Annual physical exam   [facility] at [facility], Leita DEL, MD   10 months ago Essential (primary) hypertension   [facility] at [facility], Leita DEL, MD

## 2025-03-09 ENCOUNTER — Encounter: Payer: Medicare (Managed Care) | Admitting: Student
